# Patient Record
Sex: Male | Born: 1985 | Race: White | Hispanic: No | Marital: Single | Smoking: Former smoker
Health system: Southern US, Community
[De-identification: ages and names within clinical notes are randomized; demographics above are authoritative.]

---

## 2005-08-17 ENCOUNTER — Inpatient Hospital Stay (HOSPITAL_COMMUNITY): Admission: EM | Admit: 2005-08-17 | Discharge: 2005-08-19 | Payer: Self-pay | Admitting: Emergency Medicine
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
## 2017-09-26 DIAGNOSIS — J9801 Acute bronchospasm: Secondary | ICD-10-CM | POA: Diagnosis not present

## 2017-09-26 DIAGNOSIS — J309 Allergic rhinitis, unspecified: Secondary | ICD-10-CM | POA: Diagnosis not present

## 2017-12-09 DIAGNOSIS — H6123 Impacted cerumen, bilateral: Secondary | ICD-10-CM | POA: Diagnosis not present

## 2017-12-09 DIAGNOSIS — H9192 Unspecified hearing loss, left ear: Secondary | ICD-10-CM | POA: Diagnosis not present

## 2018-03-06 DIAGNOSIS — Z0000 Encounter for general adult medical examination without abnormal findings: Secondary | ICD-10-CM | POA: Diagnosis not present

## 2018-03-06 DIAGNOSIS — Z136 Encounter for screening for cardiovascular disorders: Secondary | ICD-10-CM | POA: Diagnosis not present

## 2018-03-06 DIAGNOSIS — Z23 Encounter for immunization: Secondary | ICD-10-CM | POA: Diagnosis not present

## 2018-03-06 DIAGNOSIS — Z13220 Encounter for screening for lipoid disorders: Secondary | ICD-10-CM | POA: Diagnosis not present

## 2020-03-15 ENCOUNTER — Encounter: Payer: Self-pay | Admitting: *Deleted

## 2020-03-15 ENCOUNTER — Telehealth: Payer: Self-pay | Admitting: *Deleted

## 2020-03-15 NOTE — Telephone Encounter (Signed)
Patient had Abbott ID now rapid antigen and PCR test performed at [facility] today.  Abbott binaxnow negative.  PCR test pending.  He reported that after drinking pot of tea this afternoon symptoms resolved except rhinitis mild now  Patient reported seasonal allergic rhinitis fall flare his usual.  Reviewed medical literature [email or web address]  Correlation between Abbott ID now and PCR tests "The study enrolled 785 symptomatic patients, of whom 21 were positive by both ID NOW and RT-PCR, and 2 only by RT-PCR. All 189 asymptomatic patients tested negative. The positive percent agreement between the ID NOW assay and the RT-PCR assay was 91.3%, and negative percent agreement was 100%. "  Patient cleared to return to work tomorrow by me but still has to obtain clearance from [employer] prior to returning onsite.  If new or worsening symptoms overnight especially fever, vomiting, diarrhea to stay home tomorrow and contact clinic staff via [email or web address] as clinic closed Wed/Sat/Sun.  RN Rolly Salter discussed case with me and will notify HR Tim and patient.

## 2020-03-15 NOTE — Telephone Encounter (Signed)
Noted agreed with plan of care 

## 2020-03-15 NOTE — Telephone Encounter (Signed)
RN notified by email that pt called out of work for today last night. Spoke with pt over phone. He reports camping outdoors this past weekend and started having a mild sore throat and voice hoarseness upon his return home yesterday, 10/11. He takes allergy meds year-round. No missed doses recently. Has already scheduled a rapid antigen test in [place] today at 1600. Case discussed with NP Inetta Fermo. Advised pt to keep testing appt today. Pt given RN and NP email addresses and asked to ask for brand/type of test when performed and send this to NP to check accuracy/sensitivity since not sendout PCR. Pt expets results back within of test today. Since clinic closed tomorrow, pt to email results this evening and sx will be checked again at that point. If improved and negative test will allow back to work tomorrow. If sx not resolved or positive test, will start quarantine. Pt fully vaccinated against Covid. Received Moderna vax x2 in April and May 2021.

## 2020-03-17 NOTE — Telephone Encounter (Signed)
Please verify no symptoms worsening/new and back at work

## 2021-03-23 ENCOUNTER — Telehealth: Payer: Self-pay | Admitting: *Deleted

## 2021-03-23 NOTE — Telephone Encounter (Signed)
Pt requested meeting time to discuss tobacco cessation. Met in clinic this afternoon. He reports he has been thinking about quitting smoking for his own health and for the wellness premium discount benefit. He seeks resources to assist him. Sts he doesn't feel he gets cravings for nicotine but more for the routine. He smokes a tobacco pipe and enjoys sitting outside on the porch but with colder weather coming feels this is a good time to enact the quitting he has been thinking about.  Reviewed Quit Smart classes through [employer]. Benefits of this program and high rate of success. Also provided links for [employer], QuitLine [place], and [email or web address]-[email or web address]/. Discussed triggers, journaling, setting a quit date, nicotine replacement therapy. Advised if he would like to discuss nicotine replacement therapy, an appt can be made with NP Otila Kluver.  Pt verbalizes understanding and will contact RN back once he reviews resources, need an appt, or any additional help.

## 2021-06-14 ENCOUNTER — Encounter: Payer: Self-pay | Admitting: Registered Nurse

## 2021-06-14 ENCOUNTER — Telehealth: Payer: Self-pay | Admitting: Registered Nurse

## 2021-06-14 DIAGNOSIS — Z20822 Contact with and (suspected) exposure to covid-19: Secondary | ICD-10-CM

## 2021-06-14 NOTE — Telephone Encounter (Signed)
HR [employer] Alexis notified NP that pt reported close covid contact on 06/12/2021 Day 0.  Currently asymptomatic and fully vaccinatinated/up to date with bivalent vaccine also.    Patient to wear mask x 10 days and covid test day 5 after exposure 06/17/2021.  Patient plans to covid test tonight after work also.  Discussed no eating in employee lunch room or at desk as he does not have private office.  Discussed may eat outside or in lactation/meditation rooms across from clinic or in car through day 10 06/22/2021.  Reviewed possible Covid symptoms including cough, shortness of breath with exertion or at rest, runny nose, congestion, sinus pain/pressure, sore throat, fever/chills, body aches, fatigue, loss of taste/smell, GI symptoms of nausea/vomiting/diarrhea.   Patient to isolate in own room and if possible use only one bathroom if living with others in home.  Wear mask when out of room to help prevent spread to others in household.  Sanitize high touch surfaces with lysol/chlorox/bleach spray or wipes daily as viruses are known to live on surfaces from 24 hours to days.  Patient currently does not have active my chart account.  Patient stated he would like to activate.  Discussed I can send him handouts and he can view his labs/office visit information in my chart.  Patient stated to send him activation test/completed.  Patient reported he has covid tests at home.  Patient asked if he could still visit his partner's father/family this weekend as father with cancer and undergoing chemotherapy.  I recommended video visit and reschedule his in person visit due to partner's father at risk for severe covid and I cannot guarantee he would not develop infection during trip.  Patient stated and with cold weather I would know if cold weather runny nose or covid.   Patient alert and oriented x3, spoke full sentences without difficulty.  No audible nasal congestion, cough or throat clearing during 8 minute telephone  call.  Discussed with patient he can contact me at [phone] when clinic  closed if questions or concerns.  RN Rolly Salter returns to clinic tomorrow [phone].   Pt verbalized understanding and agreement with plan of care. No further questions/concerns at this time. Pt reminded to contact clinic with any changes in symptoms or questions/concerns. HR notified patient covid testing tonight and again on 06/17/2021 currently asymptomatic and cleared to be onsite with strict mask wear through Day 10 and no eating in employee lunch room.  Patient to call out sick if symptoms develop or positive covid test and stay home/quarantine.

## 2021-06-15 MED ORDER — MOLNUPIRAVIR EUA 200MG CAPSULE: 4.0000 | ORAL_CAPSULE | Freq: Two times a day (BID) | ORAL | 0 refills | Status: AC

## 2021-06-15 NOTE — Telephone Encounter (Signed)
Patient returned call.  Reviewed Epic with patient regarding medical history/labs/imaging history.  Patient no labs on file.  Patient reported allergies and frequent sinus infections as a kid.  He required sinus surgery.  He stated quit smoking 03 Jun 2021 today day 12 without smoking.  Symptoms started this afternoon around 1530. Scratchy throat and runny nose. Height 5'10" and last weight 220lbs  Patient has received covid vaccinations and last dose this fall bivalent vaccine 07 Mar 2021 per HR records.  Discussed patient low risk for severe covid-smoking history, overweight.  Patient would like antiviral therapy.  [facility] first preference pharmacy [facility].  Discussed common side effects molnupiravir e.g. GI upset, dizziness with patient, use of contraception x 3 months and avoiding fathering children.  Emergency Use Authorization molnupiravir handout emailed to patient along with taking care of himself at home with covid/quarantine instructions.  Day 0 06/15/21  Day 5 06/20/2021  Day 10 06/25/2021 may return to work Day 6 with mask wear and no eating in employee lunch room through Day 10.  HR notified.  [name]&Ox3 spoke full sentences without difficulty; no cough/congestion/throat clearing audible during 20 minute call.  Reiterated increasing fresh air flow to home with window/hvac, disinfecting high touch surfaces daily, mask wear when in home common areas, frequent handwashing and isolating to single indoor room when possible things he can do to protect others in his household.  Discussed with patient if I have to send Rx to [place] location I would call him in the am as that pharmacy location currently closed.  If [facility] has in stock will send electronic Rx tonight. Patient can contact me at [phone] if questions or concerns and clinic closed.  RN Hildred Alamin [phone] when clinic open.  I will follow up with him this weekend for re-evaluation.  Patient  verbalized understanding information/instructions, agreed with plan of care and had no further questions at this time.    [facility] staff contacted drug in stock.  molnupiravir 221m sig t4 po BID x 5 days #40 RF0 sent to patient pharmacy of choice.  .10 Things You Can Do to Manage Your COVID-19 Symptoms at Home If you have possible or confirmed COVID-19 1.Stay home except to get medical care. 2.Monitor your symptoms carefully. If your symptoms get worse, call your healthcare provider immediately. 3.Get rest and stay hydrated. 4.If you have a medical appointment, call the healthcare provider ahead of time and tell them that you have or may have COVID-19. 5.For medical emergencies, call 911 and notify the dispatch personnel that you have or may have COVID-19. 6.Cover your cough and sneezes with a tissue or use the inside of your elbow. 7.Wash your hands often with soap and water for at least 20 seconds or clean your hands with an alcohol-based hand sanitizer that contains at least 60% alcohol. 8.As much as possible, stay in a specific room and away from other people in your home. Also, you should use a separate bathroom, if available. If you need to be around other people in or outside of the home, wear a mask. 9.Avoid sharing personal items with other people in your household, like dishes, towels, and bedding. 10.Clean all surfaces that are touched often, like counters, tabletops, and doorknobs. Use household cleaning sprays or wipes according to the label instructions. [email or web address]/16/2021 This information is not intended to replace advice given to you by your health care provider. Make sure you discuss any questions you have with  your health care provider. Document Revised: 02/10/2021 Document Reviewed: 02/10/2021 Elsevier Patient Education  [place] for Patients And Caregivers Emergency Use Authorization (EUA) Of LAGEVRIO  (molnupiravir) capsules For Coronavirus Disease 2019 (COVID-19) What is the most important information I should know about LAGEVRIO? LAGEVRIO may cause serious side effects, including:  LAGEVRIO may cause harm to your unborn baby. It is not known if LAGEVRIO will harm your baby if you take LAGEVRIO during pregnancy. o LAGEVRIO is not recommended for use in pregnancy. o LAGEVRIO has not been studied in pregnancy. LAGEVRIO was studied in pregnant animals only. When LAGEVRIO was given to pregnant animals, LAGEVRIO caused harm to their unborn babies. o You and your healthcare provider may decide that you should take LAGEVRIO during pregnancy if there are no other COVID-19 treatment options approved or authorized by the FDA that are accessible or clinically appropriate for you. o If you and your healthcare provider decide that you should take LAGEVRIO during pregnancy, you and your healthcare provider should discuss the known and potential benefits and the potential risks of taking LAGEVRIO during pregnancy. For individuals who are able to become pregnant:  You should use a reliable method of birth control (contraception) consistently and correctly during treatment with LAGEVRIO and for 4 days after the last dose of LAGEVRIO. Talk to your healthcare provider about reliable birth control methods.  Before starting treatment with [facility] your healthcare provider may do a pregnancy test to see if you are pregnant before starting treatment with LAGEVRIO.  Tell your healthcare provider right away if you become pregnant or think you may be pregnant during treatment with LAGEVRIO. Pregnancy Surveillance Program:  There is a pregnancy surveillance program for individuals who take LAGEVRIO during pregnancy. The purpose of this program is to collect information about the health of you and your baby. Talk to your healthcare provider about how to take part in this program.  If you take LAGEVRIO during pregnancy and you agree to  participate in the pregnancy surveillance program and allow your healthcare provider to share your information with [place], then your healthcare provider will report your use of [place] during pregnancy to [place]. by calling [phone] or [email or web address]. For individuals who are sexually active with partners who are able to become pregnant:  It is not known if LAGEVRIO can affect sperm. While the risk is regarded as low, animal studies to fully assess the potential for LAGEVRIO to affect the babies of males treated with LAGEVRIO have not been completed. A reliable method of birth control (contraception) should be used consistently and correctly during treatment with LAGEVRIO and for at least 3 months after the last dose. The risk to sperm beyond 3 months is not known. Studies to understand the risk to sperm beyond 3 months are ongoing. Talk to your healthcare provider about reliable birth control methods. Talk to your healthcare provider if you have questions or concerns about how LAGEVRIO may affect sperm. You are being given this fact sheet because your healthcare provider believes it is necessary to provide you with LAGEVRIO for the treatment of adults with mild-to-moderate coronavirus disease 2019 (COVID-19) with positive results of direct SARS-CoV-2 viral testing, and who are at high risk for progression to severe COVID-19 including hospitalization or death, and for whom other COVID-19 treatment options approved or authorized by the FDA are not accessible or clinically appropriate. The U.S. Food and Drug Administration (FDA) has issued an  Emergency Use Authorization (EUA) to make LAGEVRIO available during the COVID-19 pandemic (for more details about an EUA please see What is an Emergency Use Authorization? at the end of this document). LAGEVRIO is not an FDA-approved medicine in the [place]. Read this Fact Sheet for information about LAGEVRIO. Talk to your  healthcare provider about your options if you have any questions. It is your choice to take LAGEVRIO. What is COVID-19? COVID-19 is caused by a virus called a coronavirus. You can get COVID-19 through close contact with another person who has the virus. COVID-19 illnesses have ranged from very mild-to-severe, including illness resulting in death. While information so far suggests that most COVID-19 illness is mild, serious illness can happen and may cause some of your other medical conditions to become worse. Older people and people of all ages with severe, long lasting (chronic) medical conditions like heart disease, lung disease and diabetes, for example seem to be at higher risk of being hospitalized for COVID-19. What is LAGEVRIO? LAGEVRIO is an investigational medicine used to treat mild-to-moderate COVID-19 in adults:  with positive results of direct SARS-CoV-2 viral testing, and  who are at high risk for progression to severe COVID-19 including hospitalization or death, and for whom other COVID-19 treatment options approved or authorized by the FDA are not accessible or clinically appropriate. The FDA has authorized the emergency use of LAGEVRIO for the treatment of mild-tomoderate COVID-19 in adults under an EUA. For more information on EUA, see the What is an Emergency Use Authorization (EUA)? section at the end of this Fact Sheet. LAGEVRIO is not authorized:  for use in people less than 87 years of age.  for prevention of COVID-19.  for people needing hospitalization for COVID-19.  for use for longer than 5 consecutive days. What should I tell my healthcare provider before I take LAGEVRIO? Tell your healthcare provider if you:  Have any allergies  Are breastfeeding or plan to breastfeed  Have any serious illnesses  Are taking any medicines (prescription, over-the-counter, vitamins, or herbal products). How do I take LAGEVRIO?  Take LAGEVRIO exactly as your healthcare provider tells you to take it.  Take  4 capsules of LAGEVRIO every 12 hours (for example, at 8 am and at 8 pm)  Take LAGEVRIO for 5 days. It is important that you complete the full 5 days of treatment with LAGEVRIO. Do not stop taking LAGEVRIO before you complete the full 5 days of treatment, even if you feel better.  Take LAGEVRIO with or without food.  You should stay in isolation for as long as your healthcare provider tells you to. Talk to your healthcare provider if you are not sure about how to properly isolate while you have COVID-19.  Swallow LAGEVRIO capsules whole. Do not open, break, or crush the capsules. If you cannot swallow capsules whole, tell your healthcare provider.  What to do if you miss a dose: o If it has been less than 10 hours since the missed dose, take it as soon as you remember o If it has been more than 10 hours since the missed dose, skip the missed dose and take your dose at the next scheduled time.  Do not double the dose of LAGEVRIO to make up for a missed dose. What are the important possible side effects of LAGEVRIO?  See, What is the most important information I should know about LAGEVRIO?  Allergic Reactions. Allergic reactions can happen in people taking LAGEVRIO, even after only 1 dose.  Stop taking LAGEVRIO and call your healthcare provider right away if you get any of the following symptoms of an allergic reaction: o hives o rapid heartbeat o trouble swallowing or breathing o swelling of the mouth, lips, or face o throat tightness o hoarseness o skin rash The most common side effects of LAGEVRIO are:  diarrhea  nausea  dizziness These are not all the possible side effects of LAGEVRIO. Not many people have taken LAGEVRIO. Serious and unexpected side effects may happen. This medicine is still being studied, so it is possible that all of the risks are not known at this time. What other treatment choices are there? Veklury (remdesivir) is FDA-approved as an intravenous (IV) infusion for the treatment of  mildto-moderate TAVWP-79 in certain adults and children. Talk with your doctor to see if Marijean Heath is appropriate for you. Like LAGEVRIO, FDA may also allow for the emergency use of other medicines to treat people with COVID-19. Go to [email or web address] for more information. It is your choice to be treated or not to be treated with LAGEVRIO. Should you decide not to take it, it will not change your standard medical care. What if I am breastfeeding? Breastfeeding is not recommended during treatment with LAGEVRIO and for 4 days after the last dose of LAGEVRIO. If you are breastfeeding or plan to breastfeed, talk to your healthcare provider about your options and specific situation before taking LAGEVRIO. How do I report side effects with LAGEVRIO? Contact your healthcare provider if you have any side effects that bother you or do not go away. Report side effects to FDA MedWatch at SmoothHits.hu or call 1-800-FDA-1088 (1- 773-258-7668). How should I store [place]?  Store LAGEVRIO capsules at room temperature between 47F to 11F (20C to 25C).  Keep LAGEVRIO and all medicines out of the reach of children and pets. How can I learn more about COVID-19?  Ask your healthcare provider.  Visit SeekRooms.[email or web address]  Contact your local or state public health department.  Call [place] at [phone] (toll free in the [place]  Visit www.molnupiravir.com What Is an Emergency Use Authorization (EUA)? The [place] FDA has made [place] available under an emergency access mechanism called an Emergency Use Authorization (EUA) The EUA is supported by a Presenter, broadcasting Health and Human Service (HHS) declaration that circumstances exist to justify emergency use of drugs and biological products during the COVID-19 pandemic. LAGEVRIO for the treatment of mild-to-moderate COVID-19 in adults with positive results of  direct SARS-CoV-2 viral testing, who are at high risk for progression to severe COVID-19, including hospitalization or death, and for whom alternative COVID-19 treatment options approved or authorized by FDA are not accessible or clinically appropriate, has not undergone the same type of review as an FDA-approved product. In issuing an EUA under the OLMBE-67 public health emergency, the FDA has determined, among other things, that based on the total amount of scientific evidence available including data from adequate and well-controlled clinical trials, if available, it is reasonable to believe that the product may be effective for diagnosing, treating, or preventing COVID-19, or a serious or life-threatening disease or condition caused by COVID-19; that the known and potential benefits of the product, when used to diagnose, treat, or prevent such disease or condition, outweigh the known and potential risks of such product; and that there are no adequate, approved, and available alternatives. All of these criteria must be met to allow for the product to be used in the treatment of patients during the COVID-19  pandemic. The EUA for LAGEVRIO is in effect for the duration of the COVID-19 declaration justifying emergency use of LAGEVRIO, unless terminated or revoked (after which LAGEVRIO may no longer be used under the EUA). Manuf. for: [place] [address], [place] For patent information: http://rogers.info/ Copyright  [phone] [place], [place], [place] and its affiliates. All rights reserved. [id number] Revised: June 2022  Person Under Monitoring Name: FRIEND DORFMAN   Location: [place] [address]     Infection Prevention Recommendations for Individuals Confirmed to have, or Being Evaluated for, 2019 Novel Coronavirus (COVID-19) Infection Who Receive Care at Home   Individuals who are confirmed to have, or are being evaluated for, COVID-19  should follow the prevention steps below until a healthcare provider or local or state health department says they can return to normal activities.   Stay home except to get medical care You should restrict activities outside your home, except for getting medical care. Do not go to work, school, or public areas, and do not use public transportation or taxis.   Call ahead before visiting your doctor Before your medical appointment, call the healthcare provider and tell them that you have, or are being evaluated for, COVID-19 infection. This will help the healthcare providers office take steps to keep other people from getting infected. Ask your healthcare provider to call the local or state health department.   Monitor your symptoms Seek prompt medical attention if your illness is worsening (e.g., difficulty breathing). Before going to your medical appointment, call the healthcare provider and tell them that you have, or are being evaluated for, COVID-19 infection. Ask your healthcare provider to call the local or state health department.   Wear a facemask You should wear a facemask that covers your nose and mouth when you are in the same room with other people and when you visit a healthcare provider. People who live with or visit you should also wear a facemask while they are in the same room with you.   Separate yourself from other people in your home As much as possible, you should stay in a different room from other people in your home. Also, you should use a separate bathroom, if available.   Avoid sharing household items You should not share dishes, drinking glasses, cups, eating utensils, towels, bedding, or other items with other people in your home. After using these items, you should wash them thoroughly with soap and water.   Cover your coughs and sneezes Cover your mouth and nose with a tissue when you cough or sneeze, or you can cough or sneeze into your sleeve.  Throw used tissues in a lined trash can, and immediately wash your hands with soap and water for at least 20 seconds or use an alcohol-based hand rub.   Wash your [facility] your hands often and thoroughly with soap and water for at least 20 seconds. You can use an alcohol-based hand sanitizer if soap and water are not available and if your hands are not visibly dirty. Avoid touching your eyes, nose, and mouth with unwashed hands.     Prevention Steps for Caregivers and Household Members of Individuals Confirmed to have, or Being Evaluated for, COVID-19 Infection Being Cared for in the Home   If you live with, or provide care at home for, a person confirmed to have, or being evaluated for, COVID-19 infection please follow these guidelines to prevent infection:   Follow healthcare  providers instructions Make sure that you understand and can help the patient follow any healthcare provider instructions for all care.   Provide for the patients basic needs You should help the patient with basic needs in the home and provide support for getting groceries, prescriptions, and other personal needs.   Monitor the patients symptoms If they are getting sicker, call his or her medical provider and tell them that the patient has, or is being evaluated for, COVID-19 infection. This will help the healthcare providers office take steps to keep other people from getting infected. Ask the healthcare provider to call the local or state health department.   Limit the number of people who have contact with the patient ?If possible, have only one caregiver for the patient. ?Other household members should stay in another home or place of residence. If this is not possible, they should stay ?in another room, or be separated from the patient as much as possible. Use a separate bathroom, if available. ?Restrict visitors who do not have an essential need to be in the home.   Keep older adults, very young  children, and other sick people away from the patient Keep older adults, very young children, and those who have compromised immune systems or chronic health conditions away from the patient. This includes people with chronic heart, lung, or kidney conditions, diabetes, and cancer.   Ensure good ventilation Make sure that shared spaces in the home have good air flow, such as from an air conditioner or an opened window, weather permitting.   Wash your hands often ?Wash your hands often and thoroughly with soap and water for at least 20 seconds. You can use an alcohol based hand sanitizer if soap and water are not available and if your hands are not visibly dirty. ?Avoid touching your eyes, nose, and mouth with unwashed hands. ?Use disposable paper towels to dry your hands. If not available, use dedicated cloth towels and replace them when they become wet.   Wear a facemask and gloves ?Wear a disposable facemask at all times in the room and gloves when you touch or have contact with the patients blood, body fluids, and/or secretions or excretions, such as sweat, saliva, sputum, nasal mucus, vomit, urine, or feces.  Ensure the mask fits over your nose and mouth tightly, and do not touch it during use. ?Throw out disposable facemasks and gloves after using them. Do not reuse. ?Wash your hands immediately after removing your facemask and gloves. ?If your personal clothing becomes contaminated, carefully remove clothing and launder. Wash your hands after handling contaminated clothing. ?Place all used disposable facemasks, gloves, and other waste in a lined container before disposing them with other household waste. ?Remove gloves and wash your hands immediately after handling these items.   Do not share dishes, glasses, or other household items with the patient ?Avoid sharing household items. You should not share dishes, drinking glasses, cups, eating utensils, towels, bedding, or other items with a  patient who is confirmed to have, or being evaluated for, COVID-19 infection. ?After the person uses these items, you should wash them thoroughly with soap and water.   [place] thoroughly ?Immediately remove and wash clothes or bedding that have blood, body fluids, and/or secretions or excretions, such as sweat, saliva, sputum, nasal mucus, vomit, urine, or feces, on them. ?Wear gloves when handling laundry from the patient. ?Read and follow directions on labels of laundry or clothing items and detergent. In general, wash and dry with the  warmest temperatures recommended on the label.   Clean all areas the individual has used often ?Clean all touchable surfaces, such as counters, tabletops, doorknobs, bathroom fixtures, toilets, phones, keyboards, tablets, and bedside tables, every day. Also, clean any surfaces that may have blood, body fluids, and/or secretions or excretions on them. ?Wear gloves when cleaning surfaces the patient has come in contact with. ?Use a diluted bleach solution (e.g., dilute bleach with 1 part bleach and 10 parts water) or a household disinfectant with a label that says EPA-registered for coronaviruses. To make a bleach solution at home, add 1 tablespoon of bleach to 1 quart (4 cups) of water. For a larger supply, add  cup of bleach to 1 gallon (16 cups) of water. ?Read labels of cleaning products and follow recommendations provided on product labels. Labels contain instructions for safe and effective use of the cleaning product including precautions you should take when applying the product, such as wearing gloves or eye protection and making sure you have good ventilation during use of the product. ?Remove gloves and wash hands immediately after cleaning.   Monitor yourself for signs and symptoms of illness Caregivers and household members are considered close contacts, should monitor their health, and will be asked to limit movement outside of the home to the  extent possible. Follow the monitoring steps for close contacts listed on the symptom monitoring form.     ? If you have additional questions, contact your local health department or call the epidemiologist on call at [phone] (available 24/7). ? This guidance is subject to change. For the most up-to-date guidance from [facility], please refer to their website: [email or web address]

## 2021-06-15 NOTE — Telephone Encounter (Signed)
Notified by HR Tim patient reported his home covid test positive tonight.  Attempted to reach patient via telephone no answer left message.  Patient may contact NP when clinic closed if questions/concerns [phone] and RN Rolly Salter in clinic tomorrow [phone].  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 Spoke full sentences without difficulty. Gait sure and steady respirations even and unlabored skin warm dry and pink.

## 2021-06-20 NOTE — Telephone Encounter (Signed)
Spoke with pt by phone. He reports some lingering nasal congestion and mild sometimes productive cough but feeling much better than when sx started. Cleared to RTW onsite Day 6 1/18 with strict mask use thru Day 10 1/22.

## 2021-06-21 NOTE — Telephone Encounter (Signed)
Patient contacted via telephone and stated he left after 3 hours due to fatigue.  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  Some nausea prior to leaving work but denied vomiting/diarrhea.  Nasal congestion continues but denied new symptoms.  Feels able to return to work tomorrow. Discussed if doesn't feel able to finish shift tomorrow to come see clinic staff for evaluation.  [name]&Ox3 spoke full sentences without difficulty.  Nasal congestion audible during 2 minute telephone call.  No audible cough/throat clearing.  HR notified RTW as expected 1/18 but had to leave early due to fatigue.  Cleared to RTW 06/22/21.  Patient verbalized understanding information/instructions, agreed with plan of care and had no further questions at this time.

## 2021-06-22 NOTE — Telephone Encounter (Signed)
Pt returned call and reported he is onsite today. Feeling better than yesterday but fatigue and congestion persist.  He also reported that he realized he read the molnupiravir instructions incorrectly and was taking 1 pill 4x/day. So he only took about half. He wants to know if he should continue taking at the correct dose until finished or stop medication.

## 2021-06-22 NOTE — Telephone Encounter (Signed)
Discussed with NP Tina molnupiravir dosing. She recommends finishing antivirals at correct dosing. Pt made aware by phone.

## 2021-06-24 NOTE — Telephone Encounter (Signed)
Case discussed with RN Rolly Salter patient to start taking molnupiravir at correct dosage until completed and will follow up with patient via telephone this weekend.  Reviewed RN Rolly Salter note agreed with plan of care.

## 2021-07-09 NOTE — Telephone Encounter (Signed)
Patient contacted via telephone and he reported all symptoms resolved and feeling well.  Tested negative on Day 11.  First day back at work was tough/over exerted but since then symptoms improved then resolved and back to normal activity/health.  [name]&Ox3 spoke full sentences without difficulty; no audible cough/congestion/throat clearing during 2 minute call.  Encounter closed.

## 2021-11-28 ENCOUNTER — Ambulatory Visit: Payer: Self-pay | Admitting: *Deleted

## 2021-11-28 VITALS — BP 118/82 | Ht 72.0 in | Wt 256.0 lb

## 2021-11-28 DIAGNOSIS — E559 Vitamin D deficiency, unspecified: Secondary | ICD-10-CM

## 2021-11-28 DIAGNOSIS — Z0000 Encounter for general adult medical examination without abnormal findings: Secondary | ICD-10-CM

## 2021-11-29 LAB — CMP12+LP+TP+TSH+6AC+CBC/D/PLT
ALT: 12 IU/L (ref 0–44)
AST: 13 IU/L (ref 0–40)
Albumin/Globulin Ratio: 1.8 (ref 1.2–2.2)
Albumin: 4.6 g/dL (ref 4.0–5.0)
Alkaline Phosphatase: 58 IU/L (ref 44–121)
BUN/Creatinine Ratio: 12 (ref 9–20)
BUN: 12 mg/dL (ref 6–20)
Basophils Absolute: 0 10*3/uL (ref 0.0–0.2)
Basos: 1 %
Bilirubin Total: 0.6 mg/dL (ref 0.0–1.2)
Calcium: 8.8 mg/dL (ref 8.7–10.2)
Chloride: 101 mmol/L (ref 96–106)
Chol/HDL Ratio: 4.2 ratio (ref 0.0–5.0)
Cholesterol, Total: 207 mg/dL — ABNORMAL HIGH (ref 100–199)
Creatinine, Ser: 0.99 mg/dL (ref 0.76–1.27)
EOS (ABSOLUTE): 0.1 10*3/uL (ref 0.0–0.4)
Eos: 2 %
Estimated CHD Risk: 0.8 times avg. (ref 0.0–1.0)
Free Thyroxine Index: 1.7 (ref 1.2–4.9)
GGT: 18 IU/L (ref 0–65)
Globulin, Total: 2.5 g/dL (ref 1.5–4.5)
Glucose: 86 mg/dL (ref 70–99)
HDL: 49 mg/dL (ref >39)
Hematocrit: 45.4 % (ref 37.5–51.0)
Hemoglobin: 15.4 g/dL (ref 13.0–17.7)
Immature Grans (Abs): 0 10*3/uL (ref 0.0–0.1)
Immature Granulocytes: 0 %
Iron: 111 ug/dL (ref 38–169)
LDH: 202 IU/L (ref 121–224)
LDL Chol Calc (NIH): 129 mg/dL — ABNORMAL HIGH (ref 0–99)
Lymphocytes Absolute: 2.3 10*3/uL (ref 0.7–3.1)
Lymphs: 32 %
MCH: 30.1 pg (ref 26.6–33.0)
MCHC: 33.9 g/dL (ref 31.5–35.7)
MCV: 89 fL (ref 79–97)
Monocytes Absolute: 0.5 10*3/uL (ref 0.1–0.9)
Monocytes: 8 %
Neutrophils Absolute: 4.2 10*3/uL (ref 1.4–7.0)
Neutrophils: 57 %
Phosphorus: 3.3 mg/dL (ref 2.8–4.1)
Platelets: 222 10*3/uL (ref 150–450)
Potassium: 4.2 mmol/L (ref 3.5–5.2)
RBC: 5.11 x10E6/uL (ref 4.14–5.80)
RDW: 12.1 % (ref 11.6–15.4)
Sodium: 135 mmol/L (ref 134–144)
T3 Uptake Ratio: 28 % (ref 24–39)
T4, Total: 6.2 ug/dL (ref 4.5–12.0)
TSH: 1.91 u[IU]/mL (ref 0.450–4.500)
Total Protein: 7.1 g/dL (ref 6.0–8.5)
Triglycerides: 161 mg/dL — ABNORMAL HIGH (ref 0–149)
Uric Acid: 7.3 mg/dL (ref 3.8–8.4)
VLDL Cholesterol Cal: 29 mg/dL (ref 5–40)
WBC: 7.2 10*3/uL (ref 3.4–10.8)
eGFR: 101 mL/min/{1.73_m2} (ref >59)

## 2021-11-29 LAB — VITAMIN D 25 HYDROXY (VIT D DEFICIENCY, FRACTURES): Vit D, 25-Hydroxy: 16.5 ng/mL — ABNORMAL LOW (ref 30.0–100.0)

## 2021-11-29 LAB — HGB A1C W/O EAG: Hgb A1c MFr Bld: 5.2 % (ref 4.8–5.6)

## 2021-11-30 MED ORDER — CHOLECALCIFEROL 1.25 MG (50000 UT) PO TABS: 1.0000 | ORAL_TABLET | ORAL | 0 refills | Status: AC

## 2021-11-30 NOTE — Addendum Note (Signed)
Addended by: Albina Billet A on: 11/30/2021 09:21 AM   Modules accepted: Orders

## 2021-12-01 NOTE — Progress Notes (Signed)
Spoke with pt at workstation. Confirmed pharmacy of choice. Has not picked up Rx yet but plans to this weekend. PCP office confirmed as well. Tried to login to MyChart but could not complete setup. Will finish this weekend and review NP notes.

## 2021-12-07 NOTE — Progress Notes (Signed)
Reviewed RN Haley results note agreed with plan of care 

## 2022-01-23 ENCOUNTER — Ambulatory Visit: Payer: Self-pay | Admitting: Registered Nurse

## 2022-01-23 VITALS — BP 134/86 | HR 76 | Temp 98.6°F | Resp 16 | Ht 71.5 in | Wt 256.0 lb

## 2022-01-23 DIAGNOSIS — Z0279 Encounter for issue of other medical certificate: Secondary | ICD-10-CM

## 2022-01-23 DIAGNOSIS — J302 Other seasonal allergic rhinitis: Secondary | ICD-10-CM

## 2022-01-23 DIAGNOSIS — H0019 Chalazion unspecified eye, unspecified eyelid: Secondary | ICD-10-CM | POA: Insufficient documentation

## 2022-01-23 DIAGNOSIS — Z87891 Personal history of nicotine dependence: Secondary | ICD-10-CM | POA: Insufficient documentation

## 2022-01-23 DIAGNOSIS — E559 Vitamin D deficiency, unspecified: Secondary | ICD-10-CM

## 2022-01-23 DIAGNOSIS — E669 Obesity, unspecified: Secondary | ICD-10-CM | POA: Insufficient documentation

## 2022-01-23 NOTE — Progress Notes (Unsigned)
   Subjective:    Patient ID: Adam Petersen, male    DOB: 1986-05-03, 36 y.o.   MRN: [id number]  HPI    Review of Systems     Objective:   Physical Exam        Assessment & Plan:   A-medical certificate encounter, history of tobacco use  S-Employer/safety officer [employer] scheduled patient for DOT physical.  Stated drug testing is not to be performed.  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  Due to elevated lipids/obesity only 1 year certificate given.   Patient to follow up in 1 year. Passed snellen test today but has not seen optometry for routine exam in over 5 years discussed recommend routine eye exams every 2 years with optometrist of his choice.  Annual dental visits-patient stated scheduled for cleaning twice a year/exams.  Return sooner if new medical problems/hospitalization/ER visit.  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  Patient verbalized understanding information/instructions, agreed with plan of care and had no further questions at this time.

## 2022-01-24 ENCOUNTER — Encounter: Payer: Self-pay | Admitting: Registered Nurse

## 2022-01-24 DIAGNOSIS — J302 Other seasonal allergic rhinitis: Secondary | ICD-10-CM | POA: Insufficient documentation

## 2022-03-06 ENCOUNTER — Ambulatory Visit: Payer: Self-pay

## 2022-03-06 DIAGNOSIS — Z23 Encounter for immunization: Secondary | ICD-10-CM

## 2022-03-31 ENCOUNTER — Telehealth: Payer: Self-pay | Admitting: Registered Nurse

## 2022-03-31 ENCOUNTER — Encounter: Payer: Self-pay | Admitting: Registered Nurse

## 2022-03-31 DIAGNOSIS — E559 Vitamin D deficiency, unspecified: Secondary | ICD-10-CM

## 2022-03-31 NOTE — Telephone Encounter (Signed)
Spoke with patient via telephone stated has been trying to get more sun on skin over the summer.  Discussed he will schedule appt with RN Vinnie Level this week to check vitamin D level if still low will start cholecalciferol 50,000 units po weekly x 12 weeks and recheck level with 2024 [facility] labs.  Patient verbalized understanding information/instructions, agreed with plan of care and had no further questions at this time.

## 2022-04-09 ENCOUNTER — Other Ambulatory Visit: Payer: Self-pay

## 2022-04-09 DIAGNOSIS — E559 Vitamin D deficiency, unspecified: Secondary | ICD-10-CM

## 2022-04-09 NOTE — Progress Notes (Signed)
Lab drawn from Right antecubital without difficulty. 2x2 and coflex applied.

## 2022-04-10 ENCOUNTER — Other Ambulatory Visit: Payer: Self-pay | Admitting: Registered Nurse

## 2022-04-10 DIAGNOSIS — E559 Vitamin D deficiency, unspecified: Secondary | ICD-10-CM

## 2022-04-10 LAB — VITAMIN D 25 HYDROXY (VIT D DEFICIENCY, FRACTURES): Vit D, 25-Hydroxy: 11.8 ng/mL — ABNORMAL LOW (ref 30.0–100.0)

## 2022-04-10 MED ORDER — CHOLECALCIFEROL 1.25 MG (50000 UT) PO TABS: 1.0000 | ORAL_TABLET | ORAL | 0 refills | Status: AC

## 2022-05-02 NOTE — Telephone Encounter (Signed)
Patient had lab drawn 04/09/22   Latest Reference Range & Units 11/28/21 09:16 04/09/22 09:35  Vitamin D, 25-Hydroxy 30.0 - 100.0 ng/mL 16.5 (L) 11.8 (L)  (L): Data is abnormally low  Sent my chart message and discussed with patient.  Follow up the week after and he stated he had picked up rx and started supplement vitamin D.  Will follow up in 3 months for repeat lab.  Denied further questions or concerns at this time.

## 2022-05-20 ENCOUNTER — Encounter: Payer: Self-pay | Admitting: Registered Nurse

## 2022-05-20 ENCOUNTER — Telehealth: Payer: Self-pay | Admitting: Registered Nurse

## 2022-05-20 DIAGNOSIS — U071 COVID-19: Secondary | ICD-10-CM

## 2022-05-20 MED ORDER — MOLNUPIRAVIR EUA 200MG CAPSULE: 4.0000 | ORAL_CAPSULE | Freq: Two times a day (BID) | ORAL | 0 refills | Status: AC

## 2022-05-20 NOTE — Telephone Encounter (Signed)
Patient left message for clinic staff "Good afternoon.  I have been feeling the weather since Monday afternoon. Took a test Tuesday 12/12 morning before work. Test came back negative symptoms remained. I masked up Thursday 12/14 afternoon and rested. Felt great Friday 12/15 morning and didn't mask at work today until I got a text from my roommate that he's positive for Covid. I masked up and came strait home to test myself and it's positive. Image attached."  Patient left second message 05/20/22 staying home tomorrow 05/21/22 still waiting for clinic staff to contact him.  Patient contacted via telephone notified RN Bess Kinds returns to office Monday 0800-5 through Thursday.  I am on vacation until Thursday and was out of town. Patient stated he spoke with teledoc and started on azelastine along with his flonase and nasal saline.  Stated was told could not start molnupiravir based on symptom start date Monday 05/14/22.  Patient reported his symptoms worsened this weekend/today/changing.  He did not home test Wed or Thur.  Pt began quarantine after positive home test Friday 05/18/22. Patient did not develop symptoms of  trouble breathing, chest pain, nausea, vomiting, diarrhea.  Patient took molnupiravir last covid infection Jan 2023.  Was given EUA.  Discussed based on positive test/worsening symptoms he is eligible through Tuesday 05/22/22 to start molnupiravir.  Discussed with patient to call Kimrey or email [email or web address] if new or worsening symptoms tonight/tomorrow.   5 day quarantine per [facility] recommendations. Day 1 of quarantine was 05/18/22. If GI upset I have recommended clear fluids then bland diet.  Avoid dairy/spicy, fried and large portions of meat while having nausea.  If vomiting hold po intake x 1 hour.  Then sips clear fluids like broths, ginger ale, power ade, gatorade, pedialyte may advance to soft/bland if no vomiting x 24 hours and appetite returned otherwise hydration main focus.    Reviewed possible Covid symptoms including cough, shortness of breath with exertion or at rest, runny nose, congestion, sinus pain/pressure, sore throat, fever/chills, body aches, fatigue, loss of taste/smell, GI symptoms of nausea/vomiting/diarrhea. Also reviewed same day/emergent eval/ER precautions of dizziness/syncope, confusion, blue tint to lips/face, severe shortness of breath/difficulty breathing/wheezing.   Patient to isolate in own room and if possible use only one bathroom if living with others in home.  Wear mask when out of room to help prevent spread to others in household.  Sanitize high touch surfaces with lysol/chlorox/bleach spray or wipes daily as viruses are known to live on surfaces from 24 hours to days.  Patient does want antivirals.  Patient at higher risk for hospitalization due to obesity, tobacco use, prehypertension, and hyperlipidemia.  Patient is on prescription medications or daily medications. If taking medications epocrates medication interaction checker used to verify if any drug interactions. Only taking OTC cough/cold/fever medication at this time dayquil/nyquil/honey.  Patient molnupiravir emergency use handout sent to patient electronically along with covid quarantine exitcare handout both in my chart and to personal email.  Discussed how to take molnupiravir 400mg  take 4 tabs by mouth every 12 hours x 5 days #40 RF0 sent to his pharmacy of choice. Discussed lemonade can sometimes help with metallic/plastic taste in mouth (side effect medication).  Discussed most common side effects GI upset and bad taste in mouth.  Use birth control/avoid fathering children for 3 months after molnupiravir use.  Discussed I recommended not having sex with anyone while sick/testing positive/10 day quarantine as could spread virus to partner.  Discussed with patient RN will call him  again tomorrow to follow up symptoms/see if questions/concerns.    Patient has  azelastine/flonase/claritin.  They are working for him.  May use salt water gargles and nasal saline 2 sprays each nostril q2h prn congestion/sore throat.  Research has shown it helps to prevent hospitalizations and decrease discomfort.   May use flonase nasal 1 spray each nostril BID prn rhinitis.  Dayquil and nyquil per manufacturer instructions. Discussed honey 1 tablespoon every 4 hours is a natural cough suppressant.  Avoid dehydration and drink water to keep urine pale yellow clear and voiding every 2-4 hours while awake.  Patient alert and oriented x3, spoke full sentences without difficulty.  Some nasal congestion audible during telephone call.  No nasal congestion/cough/throat clearing/hoarse voice/wheezing/shortness of breath during 11 minute telephone call.  Discussed with patient can contact RN Kimrey M-Th [phone] at [phone].   Pt verbalized understanding and agreement with plan of care. No further questions/concerns at this time. Pt reminded to contact clinic with any changes in symptoms or questions/concerns. HR notified patient to work remote/quarantine through Day 5 RTW estimated Day 6 05/22/22 with strict mask wear through Day 10 05/27/22 and no eating in employee lunch room.  Estimated return to work onsite 05/22/2022  Supervisor Casimer Lanius notified of excused absence.

## 2022-05-21 ENCOUNTER — Ambulatory Visit: Payer: Self-pay | Admitting: Occupational Medicine

## 2022-05-21 DIAGNOSIS — U071 COVID-19: Secondary | ICD-10-CM

## 2022-05-21 NOTE — Progress Notes (Signed)
Phone call follow up with patient states sinus symptoms improving but fatigue and weakness slightly worse needs another day. Will follow up tomorrow. RTW extended to 12/20.

## 2022-05-22 ENCOUNTER — Ambulatory Visit: Payer: Self-pay | Admitting: Occupational Medicine

## 2022-05-22 DIAGNOSIS — U071 COVID-19: Secondary | ICD-10-CM

## 2022-05-22 NOTE — Telephone Encounter (Signed)
See RN Bess Kinds note 05/21/22 patient fatigue worsened re-evaluation tomorrow by Rene Kocher  Supervisor notified RTW date extended by RN Bess Kinds

## 2022-05-22 NOTE — Progress Notes (Signed)
Phone follow up call prior to coming back to work. Patient Fever free. No NVD. Pt feeling much better. Educated if any further issues to come to the clinic.   

## 2022-05-23 ENCOUNTER — Ambulatory Visit: Payer: Self-pay | Admitting: Occupational Medicine

## 2022-05-23 DIAGNOSIS — S60511A Abrasion of right hand, initial encounter: Secondary | ICD-10-CM

## 2022-05-23 NOTE — Progress Notes (Signed)
Patient tripped hit right hand and right hip. Reports some discomfort to right hip like going to bruise. Giving Biofreeze to apply as needed. Can use heat ice tylenol and ibuprofen as needed. Patient has abrasion to right pinky knuckle cleaned with antiseptic. Educated to come back if having worsen problems.

## 2022-05-26 NOTE — Telephone Encounter (Signed)
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  Denied concerns/questions.  Day 10 05/27/22

## 2022-05-28 NOTE — Telephone Encounter (Signed)
Patient reported feeling well denied concerns.  All symptoms resolved. A&Ox3 spoke full sentences without difficulty no audible nasal congestion/throat clearing/cough/shortness of breath of wheezing during 2 minute call.  Day 10 05/27/22 encounter closed.

## 2022-06-05 ENCOUNTER — Encounter: Payer: Self-pay | Admitting: Registered Nurse

## 2022-06-05 ENCOUNTER — Telehealth: Payer: Self-pay | Admitting: Registered Nurse

## 2022-06-05 ENCOUNTER — Ambulatory Visit: Payer: Self-pay | Admitting: Occupational Medicine

## 2022-06-05 DIAGNOSIS — J069 Acute upper respiratory infection, unspecified: Secondary | ICD-10-CM

## 2022-06-05 NOTE — Progress Notes (Signed)
Phone call follow extreme fatigue HA body aches. Only slightly better than this morning.  Recovering from GI virus. Will follow tomorrow notify HR and Supervisor. RTW tentative 06/07/21. Encourage to rest and push fluids water and Gatorade.

## 2022-06-06 ENCOUNTER — Ambulatory Visit: Payer: Self-pay | Admitting: Occupational Medicine

## 2022-06-06 DIAGNOSIS — J069 Acute upper respiratory infection, unspecified: Secondary | ICD-10-CM

## 2022-06-06 NOTE — Progress Notes (Signed)
  Phone call follow up doing much better. RTW tomorrow notify HR and Supervisor.

## 2022-06-06 NOTE — Telephone Encounter (Signed)
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  GI upset not uncommon for patient on long drives 02 Jun 3784.  Had meal with parents for Christmas and both tested positive for covid 30 May 2022.  Fatigue 03 Jun 2022 started worsened 1 Jan and 05 Jun 2022.  Can't find his thermometer at home unsure if fever but not having sweats/chills.  GI upset has resolved but "feeling like I was hit by a truck today"  "feels just like covid I recently had" (positive home test 05/18/22) Unable to work contacting clinic staff for advice.  Discussed covid test today and repeat in 48 hours.  He has tests at home and will covid test.  Patient had not tested because he thought if he tested was just from other infection still.  Discussed the differences between types of covid tests and some test for virus particles and others just live virus.  He has live virus tests at home.  [name]&Ox3 voice sounds fatigued/low energy.  Discussed hydrating with soup broth, gatorade, powerade.  Naps if needed.  Avoiding juice as can worsen diarrhea symptoms.  Discussed many viruses circulating in community flu, metapneumovirus, RSV, adenovirus, gastroenteritis viral, and covid.  JN1 newest covid variant circulating and has been on the rise in the past 4 weeks now accounting for 50% of [place] cases this week per CDC.  RN Kimrey notified HR team and supervisor patient excused absence today for viral illness and re-evaluation tomorrow.   Patient notified RN Evlyn Kanner will follow up with him prior to 5pm today/re-evaluation after nap.   [name]&Ox3 spoke full sentences without difficulty respirations even and unlabored no audible throat clearing/cough/wheezing/shortness of breath during 8 minute telephone call.  Patient verbalized understanding information/instructions, agreed with plan of care and had no further questions at this time.

## 2022-06-22 ENCOUNTER — Other Ambulatory Visit (HOSPITAL_BASED_OUTPATIENT_CLINIC_OR_DEPARTMENT_OTHER): Payer: Self-pay

## 2022-06-22 MED ORDER — COVID-19 MRNA VAC-TRIS(PFIZER) 30 MCG/0.3ML IM SUSY
0.3000 mL | PREFILLED_SYRINGE | Freq: Once | INTRAMUSCULAR | 0 refills | Status: AC
  Filled 2022-06-22: qty 0.3, 1d supply, fill #0

## 2022-06-28 ENCOUNTER — Telehealth: Payer: Self-pay | Admitting: Registered Nurse

## 2022-06-28 MED ORDER — OSELTAMIVIR PHOSPHATE 75 MG PO CAPS: 75.0000 mg | ORAL_CAPSULE | Freq: Two times a day (BID) | ORAL | 0 refills | Status: AC

## 2022-06-28 NOTE — Telephone Encounter (Signed)
Spoke with patient via telephone was notified by supervisor not feeling well.  Stated rhinitis/congestion started 06/26/22 pm.  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  Partner with him in truck starting to have symptoms today also.  Congestion/fatigue/body aches/rhinitis/fatigued/mental fog.  No thermometer with him unsure if fever/typically runs hots wears shorts year round.  His partner told him he feels hot today.  Had [facility] visit today and rx sent to pharmacy awaiting them to be filled and pick up at 3pm today.  Patient with recent covid infection Dec 2023.  Discussed same variants circulating in [place] at this time.  He stated does not feel like covid he had in December/different.  But his previous covid infection did not feel like Dec episode either.  Discussed with patient flu, RSV, adenovirus, covid all circulating in [place] at this time high.  Most likely not a new variant of covid and if fever/body aches/URI most likely flu.  Patient would like antivirals electronic Rx sent to [facility] tamiflu 75mg  po BID x 5 days #10 RF0.  Discussed hydrating/mask wear when around others to prevent spread/hand washing/hand sanitizing frequently.  Avoid dehydration drink water to keep urine pale yellow clear.  Discussed with patient to speak with his supervisor if he does not feel safe to drive at any time.  Patient plans to take nap now while waiting for pharmacy to fill his medication from [facility] prednisone/inhaler?  He has OTC cough/cold/allergy medication.  He thought at first this was allergy flare until worsening yesterday and today.  [name]&Ox3 spoke full sentences without difficulty respirations even and unlabored no audible cough/throat clearing during 10 minute call.  Flu and viral URI handouts sent to patient my chart account.  Patient to contact NP or teledoc if further questions or concerns.  Patient verbalized understanding  information/instructions, agreed with plan of care and had no further questions at this time.  Patient supervisor and HR notified viral URI most likely flu and I will re-evaluate patient via telephone this weekend.

## 2022-06-29 NOTE — Telephone Encounter (Signed)
Patient RTW 06/07/22.  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

## 2022-06-29 NOTE — Telephone Encounter (Signed)
Patient contacted via telephone stated he slept 18 hours last night.  Was unable to fill tamiflu but did get inhaler and it helped greatly with his breathing.  Not having mental fog today and energy level improved after restful sleep last night.  A&Ox3 spoke full sentences without difficulty no audible cough/congestion/throat clearing/wheezing or shortness of breath.  Patient plans to pick up tamiflu from pharmacy this weekend.  Discussed hydrating/rest/avoid skipping meals..  Patient agreed with plan of care and had no further questions at this time.

## 2022-07-01 NOTE — Telephone Encounter (Signed)
Patient contacted NP via telephone stated no Rx at [facility] when he went to pick up 06/30/22.  [facility]ed [facility]. Discussed can ask pharmacist to transfer Rx to his location or if I need to give new order today via telephone pharmacist can call [phone] as will not be at home with computer for a few more hours.  Patient stated feeling fatigued, some body aches, possible fever still today.  Will contact patient via telephone again tomorrow to follow up symptoms as fever will need to be resolved 24 hours before returning to work onsite per employer policy.  [name]&Ox3 spoke full sentences without difficulty, agreed with plan of care and had no further questions at this time.

## 2022-07-01 NOTE — Telephone Encounter (Unsigned)
Patient contacted via telephone and stated he was able to pick up and start tamilflu yesterday.  He has been using his albuterol inhaler with meals and prior to bed.  Flonase nasal spray otc 1 spray each nostril twice a day.  Still having productive cough and using nyquil OTC which is helping.  Patient has noticed some wheezing that resolves with inhaler use.  Discussed azelastine nasal spray good for allergy related runny nose.  Flonase can be used for allergies/virus or bacteria sinus infections.  Hydrate and eat meals. Discussed to notify me if worsening or not responding to inhaler use  Discussed may need steroid taper.  Patient reported no fever today but did have chills this am.  Was able to sleep well at home last night.  Patient scheduled off work 29 & 30 Jan.  Discussed will follow up with patient via telephone 03 Jul 2022.  [name]&Ox3 spoke full sentences without difficulty; no audible congestion/throat clearing/wheezing/shortness of breath or cough during 7 minute telephone call.

## 2022-07-03 NOTE — Telephone Encounter (Signed)
[name]&Ox3 spoke full sentences without difficulty; no audible congestion/throat clearing/wheezing/shortness of breath or cough during call. Patient reports still having a lot of hacking productive cough. Still using OTC meds helping. Still very fatigue no fever. Patient reports today having some lightheadedness. Will follow up tomorrow. Notify Supervisor and HR tentative RTW 2/1.

## 2022-07-04 ENCOUNTER — Telehealth: Payer: Self-pay | Admitting: Registered Nurse

## 2022-07-04 ENCOUNTER — Encounter: Payer: Self-pay | Admitting: Registered Nurse

## 2022-07-04 DIAGNOSIS — J111 Influenza due to unidentified influenza virus with other respiratory manifestations: Secondary | ICD-10-CM

## 2022-07-04 NOTE — Telephone Encounter (Signed)
Notified by RN Evlyn Kanner excused work absence extended due to URI symptoms and fatigue.  HR team and supervisor notified by RN Evlyn Kanner.  Re-evaluation 07/04/22 by RN Evlyn Kanner.

## 2022-07-04 NOTE — Telephone Encounter (Signed)
[name]&Ox3 spoke full sentences without difficulty; no audible congestion/throat clearing/wheezing/shortness of breath or cough during call. Patient reports still having productive cough and occasional wheezing on expiration. Reports Headache is worse today. Still having nasal congestion. Still using OTC meds helping. Still very fatigue no fever. Patient reports today having some lightheadedness. Will follow up tomorrow. Notify Supervisor and HR tentative RTW 2/2. Pt reports only being 50% improved.

## 2022-07-05 ENCOUNTER — Telehealth: Payer: Self-pay | Admitting: Occupational Medicine

## 2022-07-05 NOTE — Telephone Encounter (Signed)
Patient called to follow up on symptoms from Flu. Patient reports only 60% better Still wheezing some especially when lying. Appetite is coming back. Severe runny nose, hacking and coughing feels like wont come up. Encouraged to try Mucinex. Reminded because it thins secretion runny nose could get worse.Not sure if mucous is sinus or chest. Reports thinks is coming from throat like postnasal drip is having sinus pressure and worsen headaches in am. Reports chest feels congested like can't get up. Encourage more hydration and saline spray. No fever. Still having fatigue. Extended absence till 07/09/22. Hr and supervisor made aware.

## 2022-07-06 ENCOUNTER — Telehealth: Payer: Self-pay | Admitting: Registered Nurse

## 2022-07-06 ENCOUNTER — Encounter: Payer: Self-pay | Admitting: Registered Nurse

## 2022-07-06 DIAGNOSIS — E559 Vitamin D deficiency, unspecified: Secondary | ICD-10-CM

## 2022-07-06 DIAGNOSIS — J069 Acute upper respiratory infection, unspecified: Secondary | ICD-10-CM

## 2022-07-06 DIAGNOSIS — Z0000 Encounter for general adult medical examination without abnormal findings: Secondary | ICD-10-CM

## 2022-07-06 MED ORDER — SALINE SPRAY 0.65 % NA SOLN: 2.0000 | NASAL | 0 refills | Status: AC

## 2022-07-06 NOTE — Telephone Encounter (Signed)
Spoke with patient via telephone stated feeling better today slept until noon today.  Post nasal drip verified; denied mucous coming up from chest; he started mucinex yesterday and feeling much less congested.  Discussed I will contact him again on Sunday for re-evaluation for RTW Monday 07/09/22.  He is to contact me if new or worsening symptoms via telephone or email this weekend.  Discussed I am traveling for daughter volleyball tournament and will return his call as soon as I can[place].  A&Ox3 spoke full sentences without difficulty no audible cough/congestion/throat clearing during 2 minute call.  Discussed avoid dehydration and skipping meals.  Discussed traveling while ill and exertion ran down his immune system needs therapeutic rest this weekend.  Patient verbalized understanding information/instructions, agreed with plan of care and had no further questions at this time.

## 2022-07-08 NOTE — Telephone Encounter (Signed)
Spoke with patient via telephone stated still having some fatigue and blowing nose hourly. Used inhaler albuterol 3 times today.  Feels like he has enough energy to return to work tomorrow. Denied fever/vomiting/diarrhea.  Minimal cough.  A&Ox3 spoke full sentences without difficulty; some nasal congestion/throat clearing audible during 4 minute call.  No cough audible.  Patient feels well enough to return to work.  Discussed mask wear while symptomatic as could spread flu to others in droplets/mucous.  Frequent hand washing/sanitizing after blowing nose.  Discussed using albuterol 2 puffs on wake up and before bed and during the day as needed if protracted cough/wheezing/shortness of breath or chest tightness.  Patient to see RN Evlyn Kanner 07/09/22 for lunch check/spo2.  Patient verbalized understanding information/instructions, agreed with plan of care and had no further questions at this time.  HR Group and supervisor/manager notified he is cleared to RTW 07/09/22 and use normal call out procedures if wakes up with new/worsening symptoms in am.

## 2022-07-09 ENCOUNTER — Ambulatory Visit: Payer: Self-pay | Admitting: Occupational Medicine

## 2022-07-09 VITALS — BP 134/90 | HR 100 | Temp 98.0°F

## 2022-07-09 DIAGNOSIS — J111 Influenza due to unidentified influenza virus with other respiratory manifestations: Secondary | ICD-10-CM

## 2022-07-09 NOTE — Progress Notes (Signed)
Patient came in for fu from Flu almost fully recovered. Fatigue gone. Occasional wheeze right upper lung. Reports still some phlegm and coughing occasionally. Overall much improved RTW today. Educated if any issues to contact the clinic.

## 2022-07-11 NOTE — Telephone Encounter (Signed)
RN Evlyn Kanner notified NP spoke with patient feeling better 07/10/22 and at work wearing mask.

## 2022-07-27 NOTE — Telephone Encounter (Signed)
Patient reported symptoms resolved seen in workcenter denied concerns.  Lab orders placed for 2025 Be Well appt scheduled 08/02/22 Vitamin D, A1c and executive panel fasting.

## 2022-08-02 ENCOUNTER — Other Ambulatory Visit: Payer: Self-pay | Admitting: Occupational Medicine

## 2022-08-02 DIAGNOSIS — E559 Vitamin D deficiency, unspecified: Secondary | ICD-10-CM

## 2022-08-02 DIAGNOSIS — Z0000 Encounter for general adult medical examination without abnormal findings: Secondary | ICD-10-CM

## 2022-08-02 NOTE — Progress Notes (Signed)
Lab drawn tolerated well no issues noted.

## 2022-08-03 LAB — CMP12+LP+TP+TSH+6AC+CBC/D/PLT
Basophils Absolute: 0.1 10*3/uL (ref 0.0–0.2)
Eos: 2 %
Hematocrit: 45.4 % (ref 37.5–51.0)
Lymphocytes Absolute: 2.5 10*3/uL (ref 0.7–3.1)
Lymphs: 32 %
MCV: 89 fL (ref 79–97)
Monocytes Absolute: 0.6 10*3/uL (ref 0.1–0.9)
Monocytes: 7 %
Neutrophils Absolute: 4.6 10*3/uL (ref 1.4–7.0)
Neutrophils: 58 %
Platelets: 214 10*3/uL (ref 150–450)
WBC: 8 10*3/uL (ref 3.4–10.8)

## 2022-08-03 LAB — VITAMIN D 25 HYDROXY (VIT D DEFICIENCY, FRACTURES): Vit D, 25-Hydroxy: 15.9 ng/mL — ABNORMAL LOW (ref 30.0–100.0)

## 2022-08-04 ENCOUNTER — Encounter: Payer: Self-pay | Admitting: Registered Nurse

## 2022-08-04 LAB — CMP12+LP+TP+TSH+6AC+CBC/D/PLT
ALT: 12 IU/L (ref 0–44)
AST: 14 IU/L (ref 0–40)
Albumin/Globulin Ratio: 1.8 (ref 1.2–2.2)
Alkaline Phosphatase: 66 IU/L (ref 44–121)
BUN: 11 mg/dL (ref 6–20)
Basophils Absolute: 0.1 10*3/uL (ref 0.0–0.2)
Bilirubin Total: 0.3 mg/dL (ref 0.0–1.2)
Calcium: 9.6 mg/dL (ref 8.7–10.2)
Chloride: 105 mmol/L (ref 96–106)
Chol/HDL Ratio: 4 ratio (ref 0.0–5.0)
Globulin, Total: 2.5 g/dL (ref 1.5–4.5)
Glucose: 105 mg/dL — ABNORMAL HIGH (ref 70–99)
Hematocrit: 45.4 % (ref 37.5–51.0)
Hemoglobin: 15.3 g/dL (ref 13.0–17.7)
Immature Grans (Abs): 0 10*3/uL (ref 0.0–0.1)
Iron: 101 ug/dL (ref 38–169)
LDH: 167 IU/L (ref 121–224)
LDL Chol Calc (NIH): 114 mg/dL — ABNORMAL HIGH (ref 0–99)
MCHC: 33.7 g/dL (ref 31.5–35.7)
Neutrophils Absolute: 4.6 10*3/uL (ref 1.4–7.0)
Phosphorus: 3.4 mg/dL (ref 2.8–4.1)
Platelets: 214 10*3/uL (ref 150–450)
Potassium: 5.1 mmol/L (ref 3.5–5.2)
RDW: 12.2 % (ref 11.6–15.4)
T3 Uptake Ratio: 30 % (ref 24–39)
T4, Total: 7.5 ug/dL (ref 4.5–12.0)
VLDL Cholesterol Cal: 33 mg/dL (ref 5–40)
WBC: 8 10*3/uL (ref 3.4–10.8)

## 2022-08-04 LAB — HEMOGLOBIN A1C
Est. average glucose Bld gHb Est-mCnc: 105 mg/dL
Hgb A1c MFr Bld: 5.3 % (ref 4.8–5.6)

## 2022-08-04 MED ORDER — CHOLECALCIFEROL 1.25 MG (50000 UT) PO TABS: 1.0000 | ORAL_TABLET | ORAL | 1 refills | Status: AC

## 2022-08-04 MED ORDER — VITAMIN D3 25 MCG (1000 UT) PO CAPS: 1000.0000 [IU] | ORAL_CAPSULE | Freq: Every day | ORAL | 3 refills | Status: AC

## 2022-08-04 NOTE — Progress Notes (Signed)
My chart message sent to patient Adam Petersen, Your LDL cholesterol improving but still elevated.  Glucose spot now elevated also.  3 month blood sugar average normal.  Vitamin D still low but improving.  Consider taking the 50,000 units by mouth weekly with meal until weather/sunshine on skin improves.  Rx sent to your pharmacy of choice.  I recommend taking it during colder months repeat level in 6 months.  #12 RF1  You met requirements for Be Well insurance discount see RN Kimrey to sign paperwork A1c less than 7 and LDL less than 130.  Notify RN Evlyn Kanner if you want printed copy of results or results sent to another provider.Follow up with PCM elevated cholesterol, blood sugar and low Vitamin D; Cholesterol, high fiber diet, prediabetes/prediabetes diet and low vitamin D handouts sent to my chart.  I recommend 15 minutes sunlight on skin daily and 1000 international units D3 by mouth daily take with fattiest meal for best absorption when not taking the prescription D; BP and BMI elevated above recommended levels blood pressure greater than 110s/60s and height weight ratio of 25 I recommend weight loss, schedule appt with dietitian [facility] ([email or web address]), exercise 150 minutes per week; dietary fiber 30 grams per day men up to date; eat whole grains/fruits/vegetables; keep added sugars to less than 150 calories/9 teaspoons for men per American Heart Association;  electrolytes, iron, kidney/liver/thyroid function, and complete blood count normal  Please let us know if you have further questions and concerns.  Schedule next lab appts with RN Kimrey. Sincerely, Gerarda Fraction NP-C

## 2022-08-04 NOTE — Addendum Note (Signed)
Addended by: Gerarda Fraction A on: 08/04/2022 11:07 AM   Modules accepted: Orders

## 2022-08-13 ENCOUNTER — Ambulatory Visit: Payer: Self-pay | Admitting: Occupational Medicine

## 2022-08-13 VITALS — BP 138/90 | Ht 73.0 in | Wt 264.0 lb

## 2022-08-13 DIAGNOSIS — Z0000 Encounter for general adult medical examination without abnormal findings: Secondary | ICD-10-CM

## 2022-08-13 DIAGNOSIS — E559 Vitamin D deficiency, unspecified: Secondary | ICD-10-CM

## 2022-08-14 NOTE — Progress Notes (Signed)
Your LDL cholesterol improving but still elevated.  Glucose spot now elevated also.  3 month blood sugar average normal.  Vitamin D still low but improving.  Consider taking the 50,000 units by mouth weekly with meal until weather/sunshine on skin improves.  Rx sent to your pharmacy of choice.  NP recommend taking it during colder months repeat level in 6 months.  #12 RF1  Follow up with PCM elevated cholesterol, blood sugar and low Vitamin D; Cholesterol, high fiber diet, prediabetes/prediabetes diet and low vitamin D handouts sent to my chart.  NP recommend 15 minutes sunlight on skin daily and 1000 international units D3 by mouth daily take with fattiest meal for best absorption when not taking the prescription D; BP and BMI elevated above recommended levels blood pressure greater than 110s/60s and height weight ratio of 25 I recommend weight loss, schedule appt with dietitian [facility] ([email or web address])email linked , exercise 150 minutes per week; dietary fiber 30 grams per day men up to date; eat whole grains/fruits/vegetables; keep added sugars to less than 150 calories/9 teaspoons for men per American Heart Association;  electrolytes, iron, kidney/liver/thyroid function, and complete blood count normal  Please let us know if you have further questions and concerns.    Be well insurance premium discount evaluation:   Patient is smoker unable to continue be well at this time. Will consider tobacco cessation alteratives.   epic reviewed by RN Evlyn Kanner and transcribed labs.  Tobacco attestation signed. [facility] ROI formed signed. Forms placed in the chart.   Patient given handouts for [facility] pharmacies and discount drugs list,MyChart, Tele doc setup, [facility], [place] counseling and [facility] counseling.  What to do for infectious illness protocol. Given handout for list of medications that can be filled at [facility]. Given Clinic hours and Clinic Email.

## 2022-10-09 ENCOUNTER — Encounter: Payer: Self-pay | Admitting: Registered Nurse

## 2022-10-09 ENCOUNTER — Ambulatory Visit: Payer: Self-pay | Admitting: Registered Nurse

## 2022-10-09 DIAGNOSIS — F17200 Nicotine dependence, unspecified, uncomplicated: Secondary | ICD-10-CM | POA: Insufficient documentation

## 2022-10-09 DIAGNOSIS — H00011 Hordeolum externum right upper eyelid: Secondary | ICD-10-CM

## 2022-10-09 MED ORDER — CARBOXYMETHYLCELLULOSE SOD PF 0.5 % OP SOLN: 1.0000 [drp] | Freq: Three times a day (TID) | OPHTHALMIC | 0 refills | Status: AC | PRN

## 2022-10-09 MED ORDER — ERYTHROMYCIN 5 MG/GM OP OINT: 1.0000 | TOPICAL_OINTMENT | Freq: Four times a day (QID) | OPHTHALMIC | 0 refills | Status: AC

## 2022-10-09 NOTE — Patient Instructions (Signed)
Stye ?A stye, also known as a hordeolum, is a bump that forms on an eyelid. It may look like a pimple next to the eyelash. A stye can form inside the eyelid (internal stye) or outside the eyelid (external stye). A stye can cause redness, swelling, and pain on the eyelid. ?Styes are very common. Anyone can get them at any age. They usually occur in just one eye at a time, but you may have more than one in either eye. ?What are the causes? ?A stye is caused by an infection. The infection is almost always caused by bacteria called Staphylococcus aureus. This is a common type of bacteria that lives on the skin. ?An internal stye may result from an infected oil-producing gland inside the eyelid. An external stye may be caused by an infection at the base of the eyelash (hair follicle). ?What increases the risk? ?You are more likely to develop a stye if: ?You have had a stye before. ?You have any of these conditions: ?Red, itchy, inflamed eyelids (blepharitis). ?A skin condition such as seborrheic dermatitis or rosacea. ?High fat levels in your blood (lipids). ?Dry eyes. ?What are the signs or symptoms? ?The most common symptom of a stye is eyelid pain. Internal styes are more painful than external styes. Other symptoms may include: ?Painful swelling of your eyelid. ?A scratchy feeling in your eye. ?Tearing and redness of your eye. ?A pimple-like bump on the edge of the eyelid. ?Pus draining from the stye. ?How is this diagnosed? ?Your health care provider may be able to diagnose a stye just by examining your eye. The health care provider may also check to make sure: ?You do not have a fever or other signs of a more serious infection. ?The infection has not spread to other parts of your eye or areas around your eye. ?How is this treated? ?Most styes will clear up in a few days without treatment or with warm compresses applied to the area. You may need to use antibiotic drops or ointment to treat an infection. Sometimes,  steroid drops or ointment are used in addition to antibiotics. ?In some cases, your health care provider may give you a small steroid injection in the eyelid. ?If your stye does not heal with routine treatment, your health care provider may drain pus from the stye using a thin blade or needle. This may be done if the stye is large, causing a lot of pain, or affecting your vision. ?Follow these instructions at home: ?Take over-the-counter and prescription medicines only as told by your health care provider. This includes eye drops or ointments. ?If you were prescribed an antibiotic medicine, steroid medicine, or both, apply or use them as told by your health care provider. Do not stop using the medicine even if your condition improves. ?Apply a warm, wet cloth (warm compress) to your eye for 5-10 minutes, 4 to 6 times a day. ?Clean the affected eyelid as directed by your health care provider. ?Do not wear contact lenses or eye makeup until your stye has healed and your health care provider says that it is safe. ?Do not try to pop or drain the stye. ?Do not rub your eye. ?Contact a health care provider if: ?You have chills or a fever. ?Your stye does not go away after several days. ?Your stye affects your vision. ?Your eyeball becomes swollen, red, or painful. ?Get help right away if: ?You have pain when moving your eye around. ?Summary ?A stye is a bump that forms   on an eyelid. It may look like a pimple next to the eyelash. ?A stye can form inside the eyelid (internal stye) or outside the eyelid (external stye). A stye can cause redness, swelling, and pain on the eyelid. ?Your health care provider may be able to diagnose a stye just by examining your eye. ?Apply a warm, wet cloth (warm compress) to your eye for 5-10 minutes, 4 to 6 times a day. ?This information is not intended to replace advice given to you by your health care provider. Make sure you discuss any questions you have with your health care  provider. ?Document Revised: 07/27/2020 Document Reviewed: 07/27/2020 ?Elsevier Patient Education ? 2023 Elsevier Inc. ? ?

## 2022-10-09 NOTE — Progress Notes (Signed)
Subjective:    Patient ID: Adam Petersen, male    DOB: 01-05-1986, 37 y.o.   MRN: [id number]  37y/o caucasian male established patient here for evaluation stye/swelling right upper eyelid.  Has tried OTC eye drops and warm compresses.  Is not improving.  History or recurrent stye sometimes requiring drainage by eye doctor.  Denied fever/chills/changes in vision/headache.      Review of Systems  Constitutional:  Negative for chills and fever.  HENT:  Negative for congestion, trouble swallowing and voice change.   Eyes:  Positive for pain and redness. Negative for photophobia, discharge and visual disturbance.  Respiratory:  Negative for cough.   Cardiovascular:  Negative for chest pain.  Gastrointestinal:  Negative for diarrhea, nausea and vomiting.  Genitourinary:  Negative for difficulty urinating.  Musculoskeletal:  Negative for neck pain and neck stiffness.  Skin:  Positive for color change and rash. Negative for wound.  Allergic/Immunologic: Positive for environmental allergies.  Neurological:  Negative for dizziness, facial asymmetry, light-headedness, numbness and headaches.  Hematological:  Negative for adenopathy. Does not bruise/bleed easily.  Psychiatric/Behavioral:  Negative for agitation, confusion and sleep disturbance.        Objective:   Physical Exam Constitutional:      General: He is awake. He is not in acute distress.    Appearance: Normal appearance. He is well-developed and well-groomed. He is obese. He is not ill-appearing, toxic-appearing or diaphoretic.  HENT:     Head: Normocephalic and atraumatic.     Jaw: There is normal jaw occlusion.     Salivary Glands: Right salivary gland is not diffusely enlarged. Left salivary gland is not diffusely enlarged.     Right Ear: Hearing and external ear normal.     Left Ear: Hearing and external ear normal.     Nose: Nose normal. No congestion or rhinorrhea.     Mouth/Throat:     Lips: Pink. No lesions.      Mouth: Mucous membranes are moist.     Pharynx: Oropharynx is clear.  Eyes:     General: Lids are normal. Vision grossly intact. Gaze aligned appropriately. Allergic shiner present. No scleral icterus.       Right eye: Hordeolum present. No discharge.        Left eye: No discharge or hordeolum.     Extraocular Movements: Extraocular movements intact.     Conjunctiva/sclera: Conjunctivae normal.     Right eye: Right conjunctiva is not injected. No chemosis, exudate or hemorrhage.    Left eye: Left conjunctiva is not injected. No chemosis, exudate or hemorrhage.    Pupils: Pupils are equal, round, and reactive to light.      Comments: Right upper eyelid 1+/4 nonpitting edema mid eyelid/crease to lash line; macular erythema at lash line  Neck:     Trachea: Trachea and phonation normal. No abnormal tracheal secretions or tracheal deviation.  Pulmonary:     Effort: Pulmonary effort is normal.     Breath sounds: Normal breath sounds and air entry. No stridor or transmitted upper airway sounds. No wheezing.     Comments: Spoke full sentences without difficulty; no cough observed in exam room Abdominal:     General: Abdomen is flat.  Musculoskeletal:        General: Normal range of motion.     Cervical back: Normal range of motion and neck supple. No edema, erythema, signs of trauma, rigidity, torticollis or crepitus. No pain with movement. Normal range of motion.  Lymphadenopathy:  Head:     Right side of head: No submandibular or preauricular adenopathy.     Left side of head: No submandibular or preauricular adenopathy.     Cervical:     Right cervical: No superficial cervical adenopathy.    Left cervical: No superficial cervical adenopathy.  Skin:    General: Skin is warm and dry.     Capillary Refill: Capillary refill takes less than 2 seconds.     Coloration: Skin is not ashen, cyanotic, jaundiced, mottled, pale or sallow.     Findings: Erythema and rash present. No abrasion,  abscess, acne, bruising, burn, ecchymosis, signs of injury, laceration, lesion, petechiae or wound. Rash is macular. Rash is not crusting, nodular, papular, purpuric, pustular, scaling, urticarial or vesicular.     Nails: There is no clubbing.     Comments: Right upper eyelid  Neurological:     General: No focal deficit present.     Mental Status: He is alert and oriented to person, place, and time. Mental status is at baseline.     GCS: GCS eye subscore is 4. GCS verbal subscore is 5. GCS motor subscore is 6.     Cranial Nerves: Cranial nerves 2-12 are intact. No cranial nerve deficit, dysarthria or facial asymmetry.     Sensory: Sensation is intact.     Motor: Motor function is intact. No weakness, tremor, atrophy, abnormal muscle tone or seizure activity.     Coordination: Coordination is intact. Coordination normal.     Gait: Gait is intact. Gait normal.     Comments: In/out of chair without difficulty; gait sure and steady in clinic; bilateral hand grasp equal 5/5  Psychiatric:        Attention and Perception: Attention and perception normal.        Mood and Affect: Mood and affect normal.        Speech: Speech normal.        Behavior: Behavior normal. Behavior is cooperative.        Thought Content: Thought content normal.        Cognition and Memory: Cognition and memory normal.        Judgment: Judgment normal.           Assessment & Plan:  A-hordeolum externum right upper eyelid initial visit  P-Hygiene discussed.  Shower after work and prior to bedtime.  If any drainage on sheets/pillowcase you may want to launder/change more often along with your bathroom towels.  I recommend washing in hot water with bleach if you are having purulent eye discharge.  Consider using disposable wipes/towels on face.  Patient to apply warm moist packs prn up to QID. Instructed patient to not rub eyes.  Do not squeeze or pop stye/lesion.  May use over the counter eye drops/tears for pain/symptom  relief e.g refresh gtts 2 right eye TID x 7-14 days.  Erythromycin ophthalmic ointment apply 1 cm right eye QID x 7 days #1 RF0 dispensed from [facility] to patient.  Return to clinic if headache, fever greater than 100.51F, nausea/vomiting, purulent discharge/matting unable to open eye without using fingers, whole eyelid swells up or spreads to other part of your face, stye bleeds, foreign body sensation, ciliary flush, worsening photophobia or vision. Call or return to clinic as needed if these symptoms worsen or fail to improve as anticipated. Patient given [facility] handout on hordeolum. If stye does not resolve within a couple weeks you may need to see optometrist/ophthalmologist to drain stye.  Typically  a stye should get better and resolve in 1-2 weeks.  Patient verbalized agreement and understanding of treatment plan and had no further questions at this time. P2: Hand washing, avoid contact use-wear glasses.

## 2022-10-30 ENCOUNTER — Telehealth: Payer: Self-pay | Admitting: Registered Nurse

## 2022-10-30 ENCOUNTER — Encounter: Payer: Self-pay | Admitting: Registered Nurse

## 2022-10-30 DIAGNOSIS — H00011 Hordeolum externum right upper eyelid: Secondary | ICD-10-CM

## 2022-10-30 NOTE — Telephone Encounter (Signed)
Patient reported right upper eyelid hordeolum has resolved but scab still present on eyelid wondering if he needs to do anything else.  Denied pain/swelling/discharge.  Still has some erythromycin ophthalmic ointment at home  discussed apply ointment to scab avoid scratching or removing scab it is healing and no signs of infection.  Follow up re-evaluation if signs of infection e.g. redness/pain/swelling/discharge.  Patient agreed with plan of care and had no further questions at this time.

## 2022-11-01 ENCOUNTER — Ambulatory Visit: Payer: Self-pay | Admitting: Occupational Medicine

## 2022-11-01 VITALS — BP 143/87 | HR 81 | Temp 98.5°F | Resp 18

## 2022-11-01 DIAGNOSIS — J302 Other seasonal allergic rhinitis: Secondary | ICD-10-CM

## 2022-11-01 NOTE — Progress Notes (Signed)
Patient reports at [employer]ma cleaning and noted a lot of dust. Pt reports runny nose since then. Recent COVID exposure in work department wanted COVID test. Covid test negative. Patient given saline spray and nasal decongestant to help dry up. Allergy meds not helping at this time.

## 2022-11-04 NOTE — Telephone Encounter (Signed)
Spoke with patient via telephone symptoms improving denied concerns or questions.  A&Ox3 no audible congestion/throat clearing/cough during 2 minute call.

## 2022-11-18 NOTE — Progress Notes (Signed)
Noted patient has had follow up labs 

## 2022-12-30 ENCOUNTER — Other Ambulatory Visit: Payer: Self-pay | Admitting: Registered Nurse

## 2022-12-30 DIAGNOSIS — E559 Vitamin D deficiency, unspecified: Secondary | ICD-10-CM

## 2022-12-30 NOTE — Progress Notes (Signed)
Patient met with RN Kimrey3/11/24 discussed lab results/recommendations; scheduled D repeat 02/13/23 LDL 114 BP 138/90 weight 264lbs Hgba1c 5.3 ; patient met 2025 insurance discount requirements NP signed provider paperwork 08/14/22 RN Kimrey notified HR team my chart message sent to patient Adam Petersen, I have cancelled your vitamin D lab due to new national guidelines.  Finish the prescription 50,000 units once a week with meal then start 2000 units daily by mouth with meal over the counter. Please let us know if you have further questions Adam Billet NP-C

## 2023-01-15 ENCOUNTER — Encounter: Payer: Self-pay | Admitting: Registered Nurse

## 2023-01-15 ENCOUNTER — Ambulatory Visit: Payer: Self-pay | Admitting: Registered Nurse

## 2023-01-15 VITALS — BP 131/90 | HR 79

## 2023-01-15 DIAGNOSIS — H6123 Impacted cerumen, bilateral: Secondary | ICD-10-CM

## 2023-01-15 NOTE — Patient Instructions (Signed)
 Earwax Buildup, Adult Your ears make something called earwax. It helps keep germs called bacteria away and protects the skin in your ears. Sometimes, too much earwax can build up. This can cause discomfort or make it harder to hear. What are the causes? Earwax buildup can happen when you have too much earwax in your ears. Earwax is made in the outer part of your ear canal. It's supposed to fall out in small amounts over time. But if your ears aren't able to clean themselves like they should, earwax can build up. What increases the risk? You're more likely to get earwax buildup if: You clean your ears with cotton swabs. You pick at your ears. You use earplugs or in-ear headphones a lot. You wear hearing aids. You may also be more likely to get it if: You're male. You're older. Your ears naturally make more earwax. You have narrow ear canals or extra hair in your ears. Your earwax is too thick or sticky. You have eczema. You're dehydrated. This means there's not enough fluid in your body. What are the signs or symptoms? Symptoms of earwax buildup include: Not being able to hear as well. A feeling of fullness in your ear. Feeling like your ear is plugged. Fluid coming from your ear. Ear pain or an itchy ear. Ringing in your ear. Coughing or problems with balance. How is this diagnosed? Earwax buildup may be diagnosed based on your symptoms, medical history, and an ear exam. During the exam, your health care provider will look into your ear with a tool called an otoscope. You may also have tests, such as a hearing test. How is this treated? Earwax buildup may be treated by: Using ear drops. Having the earwax removed by a provider. The provider may: Flush the ear with water. Use a tool called a curette that has a loop on the end. Use a suction device. Having surgery. This may be done in severe cases. Follow these instructions at home:  Cleaning your ears Clean your ears as told  by your provider. You can clean the outside of your ears with a washcloth or tissue. Do not overclean your ears. Do not put anything into your ear unless told. This includes cotton swabs. General instructions Take over-the-counter and prescription medicines only as told by your provider. Drink enough fluid to keep your pee (urine) pale yellow. This helps thin the earwax. If you have hearing aids, clean them as told. Keep all follow-up visits. If earwax builds up in your ears often or if you use hearing aids, ask your provider how often you should have your ears cleaned. Contact a health care provider if: Your ear pain gets worse. You have a fever. You have pus, blood, or other fluid coming from your ear. You have hearing loss. You have ringing in your ears that won't go away. You feel like the room is spinning. This is called vertigo. Your symptoms don't get better with treatment. This information is not intended to replace advice given to you by your health care provider. Make sure you discuss any questions you have with your health care provider. Document Revised: 08/02/2022 Document Reviewed: 08/02/2022 Elsevier Patient Education  2024 [employer].

## 2023-01-15 NOTE — Progress Notes (Signed)
Subjective:    Patient ID: Adam Petersen, male    DOB: 1985-08-15, 37 y.o.   MRN: [id number]  37y/o established caucasian male here for evaluation decreased hearing and pain right ear after vacation to beach.  "I think ear wax built up again and water trapped behind it.  Restarted the wax drops and tried to dig out some of it myself but still can't hear and having pain"  Denied discharge, fever, chills, headache, bleeding from ears.      Review of Systems  Constitutional:  Negative for chills, diaphoresis and fever.  HENT:  Positive for ear pain and hearing loss. Negative for ear discharge, postnasal drip, rhinorrhea, trouble swallowing and voice change.   Eyes:  Negative for photophobia and visual disturbance.  Respiratory:  Negative for cough.   Gastrointestinal:  Negative for diarrhea, nausea and vomiting.  Musculoskeletal:  Negative for gait problem, neck pain and neck stiffness.  Skin:  Negative for color change, rash and wound.  Neurological:  Negative for dizziness, facial asymmetry, speech difficulty, light-headedness and headaches.  Hematological:  Negative for adenopathy.  Psychiatric/Behavioral:  Negative for agitation, confusion and sleep disturbance.        Objective:   Physical Exam Vitals and nursing note reviewed.  Constitutional:      General: He is awake. He is not in acute distress.    Appearance: Normal appearance. He is well-developed and well-groomed. He is obese. He is not ill-appearing, toxic-appearing or diaphoretic.  HENT:     Head: Normocephalic and atraumatic.     Jaw: There is normal jaw occlusion.     Salivary Glands: Right salivary gland is not diffusely enlarged or tender. Left salivary gland is not diffusely enlarged or tender.     Right Ear: Hearing and external ear normal. No decreased hearing noted. No laceration, drainage, swelling or tenderness. There is impacted cerumen. No foreign body. No mastoid tenderness.     Left Ear: Hearing and  external ear normal. No decreased hearing noted. No laceration, drainage, swelling or tenderness. There is impacted cerumen. No foreign body. No mastoid tenderness.     Ears:     Comments: Gold crumbly wax bilateral auditory canals obstructing TM 100%; patient reporting muffled hearing in exam room righter greater than left    Nose: Nose normal. No congestion or rhinorrhea.     Right Sinus: No maxillary sinus tenderness or frontal sinus tenderness.     Left Sinus: No maxillary sinus tenderness or frontal sinus tenderness.     Mouth/Throat:     Lips: Pink. No lesions.     Mouth: Mucous membranes are moist. No oral lesions or angioedema.     Dentition: No gum lesions.     Tongue: No lesions. Tongue does not deviate from midline.     Palate: No mass and lesions.     Pharynx: Oropharynx is clear. Uvula midline. No postnasal drip.     Tonsils: No tonsillar exudate.  Eyes:     General: Lids are normal. Vision grossly intact. Gaze aligned appropriately. Allergic shiner present. No scleral icterus.       Right eye: No discharge.        Left eye: No discharge.     Extraocular Movements: Extraocular movements intact.     Conjunctiva/sclera: Conjunctivae normal.     Pupils: Pupils are equal, round, and reactive to light.  Neck:     Trachea: Trachea and phonation normal.  Cardiovascular:     Rate and Rhythm: Normal  rate and regular rhythm.     Pulses: Normal pulses.          Radial pulses are 2+ on the right side and 2+ on the left side.  Pulmonary:     Effort: Pulmonary effort is normal.     Breath sounds: Normal breath sounds and air entry. No stridor or transmitted upper airway sounds. No wheezing.     Comments: Spoke full sentences without difficulty; no cough observed in exam room Abdominal:     General: Abdomen is flat.  Musculoskeletal:        General: Normal range of motion.     Right hand: Normal strength. Normal capillary refill.     Left hand: Normal strength. Normal capillary  refill.     Cervical back: Normal range of motion and neck supple. No swelling, edema, deformity, erythema, signs of trauma, lacerations, rigidity, spasms, torticollis, tenderness or crepitus. No pain with movement. Normal range of motion.     Thoracic back: No swelling, edema, deformity, signs of trauma, lacerations, spasms or tenderness.     Right lower leg: No edema.     Left lower leg: No edema.  Lymphadenopathy:     Head:     Right side of head: No submandibular, preauricular, posterior auricular or occipital adenopathy.     Left side of head: No submandibular, preauricular, posterior auricular or occipital adenopathy.     Cervical: No cervical adenopathy.     Right cervical: No superficial cervical adenopathy.    Left cervical: No superficial cervical adenopathy.  Skin:    General: Skin is warm and dry.     Capillary Refill: Capillary refill takes less than 2 seconds.     Coloration: Skin is not ashen, cyanotic, jaundiced, mottled, pale or sallow.     Findings: No abrasion, abscess, acne, bruising, burn, ecchymosis, erythema, signs of injury, laceration, lesion, petechiae, rash or wound.     Nails: There is no clubbing.  Neurological:     General: No focal deficit present.     Mental Status: He is alert and oriented to person, place, and time. Mental status is at baseline.     GCS: GCS eye subscore is 4. GCS verbal subscore is 5. GCS motor subscore is 6.     Cranial Nerves: Cranial nerves 2-12 are intact. No cranial nerve deficit, dysarthria or facial asymmetry.     Sensory: Sensation is intact.     Motor: Motor function is intact. No weakness, tremor, atrophy, abnormal muscle tone or seizure activity.     Coordination: Coordination is intact. Coordination normal.     Gait: Gait is intact. Gait normal.     Comments: In/out of chair and on/off exam table without difficulty; gait sure and steady in clinic; bilateral hand grasp equal 5/5  Psychiatric:        Attention and Perception:  Attention and perception normal.        Mood and Affect: Mood and affect normal.        Speech: Speech normal.        Behavior: Behavior normal. Behavior is cooperative.        Thought Content: Thought content normal.        Cognition and Memory: Cognition and memory normal.        Judgment: Judgment normal.           Assessment & Plan:  A-bilateral cerumen impaction  P-Verbal consent obtained for syringe irrigation and curettage of cerumen impaction bilaterally.  NP  performed syringe irrigation and curettage.  100% cerumen removed.  Patient reported slight discomfort external ear canal after procedure curretage extraction left/right ears, minor redness noted bilaterally and TMs intact without erythema.  Patient reported sounds are louder now.  Try 5-10gtts mineral oil external auditory canal daily prn itching and may put cotton ball at external ear for 20 minutes after drops instilled.  When in clinic have provider check ears to see if buildup with this method again.  Discussed purpose of earwax with patient. Discussed ear wax made when exposed to loud noises to help protect hearing. Avoid cotton applicator (Q-tip) use in ears.  exitcare handout on cerumen impaction  Patient verbalized understanding, agreed with plan of care and had no further questions at this time.

## 2023-02-07 ENCOUNTER — Encounter: Payer: Self-pay | Admitting: Registered Nurse

## 2023-02-07 ENCOUNTER — Telehealth: Payer: Self-pay | Admitting: Registered Nurse

## 2023-02-07 ENCOUNTER — Ambulatory Visit: Payer: Self-pay | Admitting: Registered Nurse

## 2023-02-07 VITALS — BP 130/88 | HR 83 | Temp 98.1°F | Resp 16

## 2023-02-07 DIAGNOSIS — J029 Acute pharyngitis, unspecified: Secondary | ICD-10-CM

## 2023-02-07 DIAGNOSIS — Z20822 Contact with and (suspected) exposure to covid-19: Secondary | ICD-10-CM

## 2023-02-07 MED ORDER — PENICILLIN V POTASSIUM 500 MG PO TABS: 500.0000 mg | ORAL_TABLET | Freq: Two times a day (BID) | ORAL | 0 refills | Status: DC

## 2023-02-07 NOTE — Progress Notes (Signed)
Subjective:    Patient ID: Adam Petersen, male    DOB: [birth year], 37 y.o.   MRN: [id number]  37y/o caucasian male single established patient here for evaluation sore throat for a couple days and notified close covid contact at work 02/06/23.  Denied fever/chills.  Has some rhinitis/post nasal drip and congestion nasal also.  Denied wheezing/dysphagia/dysphasia/dyspnea/n/v/d/f/c/loss of taste or smell  Has been around sick coworkers in the past week 1 diagnosed with covid 02/06/23      Review of Systems  Constitutional:  Negative for activity change, appetite change, chills, diaphoresis, fatigue, fever and unexpected weight change.  HENT:  Positive for congestion, postnasal drip, rhinorrhea and sore throat. Negative for dental problem, drooling, ear discharge, ear pain, facial swelling, hearing loss, mouth sores, nosebleeds, sinus pressure, sinus pain, sneezing, tinnitus, trouble swallowing and voice change.   Eyes:  Negative for photophobia, pain, discharge, redness, itching and visual disturbance.  Respiratory:  Negative for cough, choking, chest tightness, shortness of breath, wheezing and stridor.   Cardiovascular:  Negative for chest pain, palpitations and leg swelling.  Gastrointestinal:  Negative for abdominal distention, abdominal pain, blood in stool, constipation, diarrhea, nausea and vomiting.  Endocrine: Negative for cold intolerance and heat intolerance.  Genitourinary:  Negative for dysuria.  Musculoskeletal:  Negative for arthralgias, back pain, gait problem, joint swelling, myalgias, neck pain and neck stiffness.  Skin:  Negative for color change, pallor, rash and wound.  Allergic/Immunologic: Positive for environmental allergies. Negative for food allergies and immunocompromised state.  Neurological:  Negative for dizziness, tremors, seizures, syncope, facial asymmetry, speech difficulty, weakness, light-headedness, numbness and headaches.  Hematological:  Negative for  adenopathy. Does not bruise/bleed easily.  Psychiatric/Behavioral:  Negative for agitation, behavioral problems, confusion and sleep disturbance.        Objective:   Physical Exam Vitals and nursing note reviewed.  Constitutional:      General: He is awake. He is not in acute distress.    Appearance: Normal appearance. He is well-developed and well-groomed. He is obese. He is ill-appearing. He is not toxic-appearing or diaphoretic.  HENT:     Head: Normocephalic and atraumatic.     Jaw: There is normal jaw occlusion.     Salivary Glands: Right salivary gland is not diffusely enlarged or tender. Left salivary gland is not diffusely enlarged or tender.     Right Ear: Hearing, ear canal and external ear normal. No decreased hearing noted. No laceration, drainage, swelling or tenderness. A middle ear effusion is present. There is no impacted cerumen. No foreign body. No mastoid tenderness. No PE tube. No hemotympanum. Tympanic membrane is not injected, scarred, perforated, erythematous, retracted or bulging.     Left Ear: Hearing, ear canal and external ear normal. No decreased hearing noted. No laceration, drainage, swelling or tenderness. A middle ear effusion is present. There is no impacted cerumen. No foreign body. No mastoid tenderness. No PE tube. No hemotympanum. Tympanic membrane is not injected, scarred, perforated, erythematous, retracted or bulging.     Nose: Nose normal. No congestion or rhinorrhea.     Right Turbinates: Not enlarged, swollen or pale.     Left Turbinates: Not enlarged, swollen or pale.     Right Sinus: No maxillary sinus tenderness or frontal sinus tenderness.     Left Sinus: No maxillary sinus tenderness or frontal sinus tenderness.     Mouth/Throat:     Lips: Pink. No lesions.     Mouth: Mucous membranes are moist. No oral lesions.  Dentition: No gum lesions.     Tongue: No lesions. Tongue does not deviate from midline.     Palate: No mass and lesions.      Pharynx: Uvula midline. Pharyngeal swelling, posterior oropharyngeal erythema and postnasal drip present. No oropharyngeal exudate or uvula swelling.     Tonsils: No tonsillar exudate or tonsillar abscesses.     Comments: Macular erythema oropharynx; bilateral allergic shiners; cobblestoning posterior pharynx; clear discharge bilateral nares; audible nasal congestion and sniffing/throat clearing while in clinic Eyes:     General: Lids are normal. Vision grossly intact. Gaze aligned appropriately. Allergic shiner present. No scleral icterus.       Right eye: No discharge.        Left eye: No discharge.     Extraocular Movements: Extraocular movements intact.     Conjunctiva/sclera: Conjunctivae normal.     Pupils: Pupils are equal, round, and reactive to light.  Neck:     Trachea: Trachea and phonation normal. No abnormal tracheal secretions or tracheal deviation.  Cardiovascular:     Rate and Rhythm: Normal rate and regular rhythm.     Pulses: Normal pulses.          Radial pulses are 2+ on the right side and 2+ on the left side.     Heart sounds: Normal heart sounds, S1 normal and S2 normal.  Pulmonary:     Effort: Pulmonary effort is normal. No respiratory distress.     Breath sounds: Normal breath sounds and air entry. No stridor, decreased air movement or transmitted upper airway sounds. No decreased breath sounds, wheezing, rhonchi or rales.     Comments: Spoke full sentences without difficulty; no cough observed in exam room; wearing disposable surgical mask Abdominal:     General: Abdomen is flat. There is no distension.     Palpations: Abdomen is soft.     Tenderness: There is no guarding.  Musculoskeletal:        General: Normal range of motion.     Right hand: Normal strength. Normal capillary refill.     Left hand: Normal strength. Normal capillary refill.     Cervical back: Normal range of motion and neck supple. No swelling, edema, deformity, erythema, signs of trauma,  lacerations, rigidity, spasms, torticollis, tenderness, bony tenderness or crepitus. No pain with movement. Normal range of motion.     Thoracic back: No swelling, edema, deformity, signs of trauma, lacerations, spasms or tenderness. Normal range of motion.     Right lower leg: No edema.     Left lower leg: No edema.  Lymphadenopathy:     Head:     Right side of head: No submental, submandibular, tonsillar, preauricular, posterior auricular or occipital adenopathy.     Left side of head: No submental, submandibular, tonsillar, preauricular, posterior auricular or occipital adenopathy.     Cervical: No cervical adenopathy.     Right cervical: No superficial, deep or posterior cervical adenopathy.    Left cervical: No superficial or deep cervical adenopathy.  Skin:    General: Skin is warm and dry.     Capillary Refill: Capillary refill takes less than 2 seconds.     Coloration: Skin is not ashen, cyanotic, jaundiced, mottled, pale or sallow.     Findings: No abrasion, abscess, acne, bruising, burn, ecchymosis, erythema, signs of injury, laceration, lesion, petechiae, rash or wound.     Nails: There is no clubbing.  Neurological:     General: No focal deficit present.  Mental Status: He is alert and oriented to person, place, and time. Mental status is at baseline.     GCS: GCS eye subscore is 4. GCS verbal subscore is 5. GCS motor subscore is 6.     Cranial Nerves: Cranial nerves 2-12 are intact. No cranial nerve deficit, dysarthria or facial asymmetry.     Motor: Motor function is intact. No weakness, tremor, atrophy, abnormal muscle tone or seizure activity.     Coordination: Coordination is intact. Coordination normal.     Gait: Gait is intact. Gait normal.     Comments: In/out of chair without difficulty; gait sure and steady in clinic; bilateral hand grasp equal 5/5  Psychiatric:        Attention and Perception: Attention and perception normal.        Mood and Affect: Mood and  affect normal.        Speech: Speech normal.        Behavior: Behavior normal. Behavior is cooperative.        Thought Content: Thought content normal.        Cognition and Memory: Cognition and memory normal.        Judgment: Judgment normal.      Macular erythema oropharynx; cobblestoning bilateral TMs intact air fluid level clear wearing disposable mask  Home covid test negative     Assessment & Plan:   A-acute pharyngitis  P-most likely viral recovid test in 48 hours and send NP picture/notify me if positive.  Retest again in 2 days if second test negative discussed current variants not showing up positive at start of symptoms taking longer for positive test.  Stay home/quarantine if positive test or if vomiting/diarrhea.  Has covid tests at home does not need rx sent to pharmacy at this time. Usually no specific medical treatment is needed if a virus is causing the sore throat. The throat most often gets better on its own within 5 to 7 days. Antibiotic medicine does not cure viral pharyngitis.  -For acute pharyngitis caused by bacteria, your healthcare provider will prescribe an antibiotic.  -penicillin V 500mg  1 every 12 hours x 10 days #20 RF0 electronic Rx sent to patient pharmacy of choice if sees white spots back of throat -Motrin/Ibuprofen 800mg  by mouth every 8 hours with food or naproxen/naprosyn/aleve 500mg  by mouth every 12 hours with food prn pain/fever and/or Tylenol 1000mg  by mouth every 6 hours as needed for pain or fever OTC ER/call 911 if drooling, unable to swallow, trouble breathing discussed tonsillar abscess symptoms/hot potato voice. -Do not smoke.  -Avoid secondhand smoke and other air pollutants.  -Use a cool mist humidifier to add moisture to the air.  -Get plenty of rest; sleep 7-8 hours per night -You may want to rest your throat by talking less and eating a diet that is mostly liquid or soft for a day or two.  -Nonprescription throat lozenges and  mouthwashes should help relieve the soreness.  -Gargling with warm saltwater and drinking warm liquids may help. (You can make a saltwater solution by adding 1/4 teaspoon of salt to 8 ounces, or 240 mL, of warm water.)  -Change your toothbrush on your last day of antibiotic -Avoid oral intimate contact until antibiotics finished -Wash dishes/silverware/glasses in hot water or diswasher -Do not share glasses/silverware/dishes during meals that touch your lips/mouth -Usually no specific medical treatment is needed if a virus is causing the sore throat. The throat most often gets better on its own within 5 to  7 days. Antibiotic medicine does not cure viral pharyngitis. DDx mononucleosis, strep throat, hand foot and mouth, post nasal drip irritation pharynx/throat -Exitcare handout on pharyngitis acute and strep throat given to patient FOLLOW UP with clinic provider if no improvements in the next 7-10 days. Patient verbalized understanding of instructions and agreed with plan of care.  P2: Hand washing and diet.

## 2023-02-07 NOTE — Patient Instructions (Addendum)
Strep Throat, Adult Strep throat is an infection in the throat that is caused by bacteria. It is common during the cold months of the year. It mostly affects children who are 79-37 years old. However, people of all ages can get it at any time of the year. This infection spreads from person to person (is contagious) through coughing, sneezing, or having close contact. Your health care provider may use other names to describe the infection. When strep throat affects the tonsils, it is called tonsillitis. When it affects the back of the throat, it is called pharyngitis. What are the causes? This condition is caused by the Streptococcus pyogenes bacteria. What increases the risk? You are more likely to develop this condition if: You care for school-age children, or are around school-age children. Children are more likely to get strep throat and may spread it to others. You spend time in crowded places where the infection can spread easily. You have close contact with someone who has strep throat. What are the signs or symptoms? Symptoms of this condition include: Fever or chills. Redness, swelling, or pain in the tonsils or throat. Pain or difficulty when swallowing. White or yellow spots on the tonsils or throat. Tender glands in the neck and under the jaw. Bad smelling breath. Red rash all over the body. This is rare. How is this diagnosed? This condition is diagnosed by tests that check for the presence and the amount of bacteria that cause strep throat. They are: Rapid strep test. Your throat is swabbed and checked for the presence of bacteria. Results are usually ready in minutes. Throat culture test. Your throat is swabbed. The sample is placed in a cup that allows infections to grow. Results are usually ready in 1 or 2 days. How is this treated? This condition may be treated with: Medicines that kill germs (antibiotics). Medicines that relieve pain or fever. These include: Ibuprofen or  acetaminophen. Aspirin, only for people who are over the age of 37. Throat lozenges. Throat sprays. Follow these instructions at home: Medicines  Take over-the-counter and prescription medicines only as told by your health care provider. Take your antibiotic medicine as told by your health care provider. Do not stop taking the antibiotic even if you start to feel better. Eating and drinking  If you have trouble swallowing, try eating soft foods until your sore throat feels better. Drink enough fluid to keep your urine pale yellow. To help relieve pain, you may have: Warm fluids, such as soup and tea. Cold fluids, such as frozen desserts or popsicles. General instructions Gargle with a salt-water mixture 3-4 times a day or as needed. To make a salt-water mixture, completely dissolve -1 tsp (3-6 g) of salt in 1 cup (237 mL) of warm water. Get plenty of rest. Stay home from work or school until you have been taking antibiotics for 24 hours. Do not use any products that contain nicotine or tobacco. These products include cigarettes, chewing tobacco, and vaping devices, such as e-cigarettes. If you need help quitting, ask your health care provider. It is up to you to get your test results. Ask your health care provider, or the department that is doing the test, when your results will be ready. Keep all follow-up visits. This is important. How is this prevented?  Do not share food, drinking cups, or personal items that could cause the infection to spread to other people. Wash your hands often with soap and water for at least 20 seconds. If soap  and water are not available, use hand sanitizer. Make sure that all people in your house wash their hands well. Have family members tested if they have a sore throat or fever. They may need an antibiotic if they have strep throat. Contact a health care provider if: You have swelling in your neck that keeps getting bigger. You develop a rash, cough, or  earache. You cough up a thick mucus that is green, yellow-brown, or bloody. You have pain or discomfort that does not get better with medicine. Your symptoms seem to be getting worse. You have a fever. Get help right away if: You have new symptoms, such as vomiting, severe headache, stiff or painful neck, chest pain, or shortness of breath. You have severe throat pain, drooling, or changes in your voice. You have swelling of the neck, or the skin on the neck becomes red and tender. You have signs of dehydration, such as tiredness (fatigue), dry mouth, and decreased urination. You become increasingly sleepy, or you cannot wake up completely. Your joints become red or painful. These symptoms may represent a serious problem that is an emergency. Do not wait to see if the symptoms will go away. Get medical help right away. Call your local emergency services (911 in the [place] Do not drive yourself to the hospital. Summary Strep throat is an infection in the throat that is caused by the Streptococcus pyogenes bacteria. This infection is spread from person to person (is contagious) through coughing, sneezing, or having close contact. Take your medicines, including antibiotics, as told by your health care provider. Do not stop taking the antibiotic even if you start to feel better. To prevent the spread of germs, wash your hands well with soap and water. Have others do the same. Do not share food, drinking cups, or personal items. Get help right away if you have new symptoms, such as vomiting, severe headache, stiff or painful neck, chest pain, or shortness of breath. This information is not intended to replace advice given to you by your health care provider. Make sure you discuss any questions you have with your health care provider. Document Revised: 09/13/2020 Document Reviewed: 09/13/2020 Elsevier Patient Education  2024 [employer] Pharyngitis  Pharyngitis is inflammation of the throat  (pharynx). It is a very common cause of sore throat. Pharyngitis can be caused by a bacteria, but it is usually caused by a virus. Most cases of pharyngitis get better on their own without treatment. What are the causes? This condition may be caused by: Infection by viruses (viral). Viral pharyngitis spreads easily from person to person (is contagious) through coughing, sneezing, and sharing of personal items or utensils such as cups, forks, spoons, and toothbrushes. Infection by bacteria (bacterial). Bacterial pharyngitis may be spread by touching the nose or face after coming in contact with the bacteria, or through close contact, such as kissing. Allergies. Allergies can cause buildup of mucus in the throat (post-nasal drip), leading to inflammation and irritation. Allergies can also cause blocked nasal passages, forcing breathing through the mouth, which dries and irritates the throat. What increases the risk? You are more likely to develop this condition if: You are 77-62 years old. You are exposed to crowded environments such as daycare, school, or dormitory living. You live in a cold climate. You have a weakened disease-fighting (immune) system. What are the signs or symptoms? Symptoms of this condition vary by the cause. Common symptoms of this condition include: Sore throat. Fatigue. Low-grade fever. Stuffy nose (nasal  congestion) and cough. Headache. Other symptoms may include: Glands in the neck (lymph nodes) that are swollen. Skin rashes. Plaque-like film on the throat or tonsils. This is often a symptom of bacterial pharyngitis. Vomiting. Red, itchy eyes (conjunctivitis). Loss of appetite. Joint pain and muscle aches. Enlarged tonsils. How is this diagnosed? This condition may be diagnosed based on your medical history and a physical exam. Your health care provider will ask you questions about your illness and your symptoms. A swab of your throat may be done to check for  bacteria (rapid strep test). Other lab tests may also be done, depending on the suspected cause, but these are rare. How is this treated? Many times, treatment is not needed for this condition. Pharyngitis usually gets better in 3-4 days without treatment. Bacterial pharyngitis may be treated with antibiotic medicines. Follow these instructions at home: Medicines Take over-the-counter and prescription medicines only as told by your health care provider. If you were prescribed an antibiotic medicine, take it as told by your health care provider. Do not stop taking the antibiotic even if you start to feel better. Use throat sprays to soothe your throat as told by your health care provider. Children can get pharyngitis. Do not give your child aspirin because of the association with Reye's syndrome. Managing pain To help with pain, try: Sipping warm liquids, such as broth, herbal tea, or warm water. Eating or drinking cold or frozen liquids, such as frozen ice pops. Gargling with a mixture of salt and water 3-4 times a day or as needed. To make salt water, completely dissolve -1 tsp (3-6 g) of salt in 1 cup (237 mL) of warm water. Sucking on hard candy or throat lozenges. Putting a cool-mist humidifier in your bedroom at night to moisten the air. Sitting in the bathroom with the door closed for 5-10 minutes while you run hot water in the shower.  General instructions  Do not use any products that contain nicotine or tobacco. These products include cigarettes, chewing tobacco, and vaping devices, such as e-cigarettes. If you need help quitting, ask your health care provider. Rest as told by your health care provider. Drink enough fluid to keep your urine pale yellow. How is this prevented? To help prevent becoming infected or spreading infection: Wash your hands often with soap and water for at least 20 seconds. If soap and water are not available, use hand sanitizer. Do not touch your eyes,  nose, or mouth with unwashed hands, and wash hands after touching these areas. Do not share cups or eating utensils. Avoid close contact with people who are sick. Contact a health care provider if: You have large, tender lumps in your neck. You have a rash. You cough up green, yellow-brown, or bloody mucus. Get help right away if: Your neck becomes stiff. You drool or are unable to swallow liquids. You cannot drink or take medicines without vomiting. You have severe pain that does not go away, even after you take medicine. You have trouble breathing, and it is not caused by a stuffy nose. You have new pain and swelling in your joints such as the knees, ankles, wrists, or elbows. These symptoms may represent a serious problem that is an emergency. Do not wait to see if the symptoms will go away. Get medical help right away. Call your local emergency services (911 in the [place] Do not drive yourself to the hospital. Summary Pharyngitis is redness, pain, and swelling (inflammation) of the throat (pharynx). While pharyngitis  can be caused by a bacteria, the most common causes are viral. Most cases of pharyngitis get better on their own without treatment. Bacterial pharyngitis is treated with antibiotic medicines. This information is not intended to replace advice given to you by your health care provider. Make sure you discuss any questions you have with your health care provider. Document Revised: 08/17/2020 Document Reviewed: 08/17/2020 Elsevier Patient Education  2024 [employer].

## 2023-02-07 NOTE — Telephone Encounter (Signed)
Left message for patient he had close contact with another coworker who has tested positive for covid 02/06/23.  Please wear a mask at work and when around others for next 10 days.  I recommend covid testing day 6 if no symptoms (02/12/23) and if symptoms develop test and if negative retest x 2 48 hours later and if negative again 48 hours later.   Covid symptoms include pinkeye, fatigue, headache, chills, sore throat, nausea, vomiting, decreased appetite, diarrhea, body aches, cough, runny nose/congestion, sinus pain/pressure or altered taste/smell.  Some people think it is only allergies as runny nose/congestion their only symptom.    The current covid flirt variants are typically not testing positive on day 1 of symptoms.  We have seen day 2-4 before turning positive in the past 2 months.  Please eat outside during the next 10 days.  If inclement weather please contact HR and they will notify you of which private room available to eat indoors.  You may use microwaves in employee lunch room to heat up food.   If you need Rx sent for covid tests please let me know your pharmacy preference-insurance now requires a prescription for the 4 free per month.   If you test positive please email me a picture of positive test to this email.  If you would like covid antivirals if you test positive please let me know your pharmacy preference also.   Please let me know if you have further questions.

## 2023-02-10 NOTE — Telephone Encounter (Signed)
See 02/07/23 office note.

## 2023-02-12 ENCOUNTER — Ambulatory Visit: Payer: Self-pay | Admitting: Registered Nurse

## 2023-02-12 ENCOUNTER — Encounter: Payer: Self-pay | Admitting: Registered Nurse

## 2023-02-12 VITALS — Resp 16

## 2023-02-12 DIAGNOSIS — Z20822 Contact with and (suspected) exposure to covid-19: Secondary | ICD-10-CM

## 2023-02-12 NOTE — Progress Notes (Unsigned)
Subjective:    Patient ID: Adam Petersen, male    DOB: 01/21/1986, 37 y.o.   MRN: [id number]  HPI    Review of Systems     Objective:   Physical Exam Vitals and nursing note reviewed.  Constitutional:      General: He is awake. He is not in acute distress.    Appearance: Normal appearance. He is well-developed and well-groomed. He is obese. He is not ill-appearing, toxic-appearing or diaphoretic.  HENT:     Head: Normocephalic and atraumatic.     Jaw: There is normal jaw occlusion.     Salivary Glands: Right salivary gland is not diffusely enlarged. Left salivary gland is not diffusely enlarged.     Right Ear: Hearing and external ear normal.     Left Ear: Hearing and external ear normal.     Nose: Nose normal. No congestion or rhinorrhea.     Mouth/Throat:     Lips: Pink. No lesions.     Mouth: Mucous membranes are moist.     Pharynx: Oropharynx is clear.  Eyes:     General: Lids are normal. Vision grossly intact. Gaze aligned appropriately. No scleral icterus.       Right eye: No discharge.        Left eye: No discharge.     Extraocular Movements: Extraocular movements intact.     Conjunctiva/sclera: Conjunctivae normal.     Pupils: Pupils are equal, round, and reactive to light.  Neck:     Trachea: Trachea normal.  Pulmonary:     Effort: Pulmonary effort is normal. No respiratory distress.     Breath sounds: Normal breath sounds and air entry. No stridor or transmitted upper airway sounds. No wheezing.     Comments: Spoke full sentences without difficulty; no cough observed in exam room Abdominal:     Palpations: Abdomen is soft.  Musculoskeletal:        General: Normal range of motion.     Cervical back: Normal range of motion and neck supple.     Right lower leg: No edema.     Left lower leg: No edema.  Lymphadenopathy:     Head:     Right side of head: No submandibular or preauricular adenopathy.     Left side of head: No submandibular or preauricular  adenopathy.     Cervical:     Right cervical: No superficial cervical adenopathy.    Left cervical: No superficial cervical adenopathy.  Skin:    General: Skin is warm and dry.     Capillary Refill: Capillary refill takes less than 2 seconds.     Coloration: Skin is not ashen, cyanotic, jaundiced, mottled, pale or sallow.     Findings: No abrasion, bruising, burn, erythema, signs of injury, laceration, lesion, petechiae, rash or wound.  Neurological:     General: No focal deficit present.     Mental Status: He is alert and oriented to person, place, and time. Mental status is at baseline.     Cranial Nerves: No cranial nerve deficit.     Motor: Motor function is intact. No weakness, tremor, abnormal muscle tone or seizure activity.     Coordination: Coordination is intact. Coordination normal.     Gait: Gait is intact. Gait normal.     Comments: In/out of chair without difficulty; gait sure and steady in clinic; bilateral hand grasp equal 5/5  Psychiatric:        Attention and Perception: Attention and perception  normal.        Mood and Affect: Mood and affect normal.        Speech: Speech normal.        Behavior: Behavior normal. Behavior is cooperative.        Thought Content: Thought content normal.        Cognition and Memory: Cognition and memory normal.        Judgment: Judgment normal.    Home covid test results negative per patient/verified     Assessment & Plan:   Patient given free [place] govt test to perform prior to returning to [employer]

## 2023-02-13 ENCOUNTER — Other Ambulatory Visit: Payer: Self-pay

## 2023-02-15 NOTE — Telephone Encounter (Signed)
See office note 02/12/23.   Patient reported feeling well denied new symptoms all resolved since office visit.  A&Ox3 spoke full sentences without difficulty wearing mask in workcenter gait sure and steady respirations even and unlabored RA no audible cough/congestion/throat clearing or rhinitis.  Continue mask wear through day 10 when around others 02/16/23 and no eating in employee lunch room and monitoring for covid symptoms.  Re covid home test if symptoms develop.  Patient agreed with plan of care and had no further questions at this time.

## 2023-02-26 ENCOUNTER — Encounter: Payer: Self-pay | Admitting: Registered Nurse

## 2023-02-26 ENCOUNTER — Ambulatory Visit: Payer: Self-pay | Admitting: Registered Nurse

## 2023-02-26 VITALS — BP 126/84 | HR 78 | Temp 98.1°F

## 2023-02-26 DIAGNOSIS — J069 Acute upper respiratory infection, unspecified: Secondary | ICD-10-CM

## 2023-02-26 DIAGNOSIS — R110 Nausea: Secondary | ICD-10-CM

## 2023-02-26 NOTE — Progress Notes (Signed)
Subjective:    Patient ID: Adam Petersen, male    DOB: 03-Nov-1985, 37 y.o.   MRN: [id number]  37y/o single caucasian male here for evaluation of post nasal drip and nausea after using flonase nasal spray this am.  "Just feeling off this am"  Has not home covid tested this week.  Denied known sick contacts.  "My allergies have been acting up since [place]"  Denied diarrhea/vomiting/fever/chills.  My neck is a little sore also.     Review of Systems  Constitutional:  Positive for fatigue. Negative for chills, diaphoresis and fever.  HENT:  Positive for congestion, postnasal drip and rhinorrhea. Negative for sinus pressure and sinus pain.   Eyes:  Negative for photophobia and visual disturbance.  Respiratory:  Negative for cough, shortness of breath, wheezing and stridor.   Cardiovascular:  Negative for chest pain.  Gastrointestinal:  Positive for nausea. Negative for abdominal distention, abdominal pain, anal bleeding, blood in stool, diarrhea and vomiting.  Genitourinary:  Negative for difficulty urinating.  Musculoskeletal:  Positive for myalgias. Negative for back pain and gait problem.  Skin:  Negative for color change and rash.  Allergic/Immunologic: Positive for environmental allergies.  Neurological:  Positive for headaches. Negative for dizziness, tremors, seizures, syncope, facial asymmetry, speech difficulty, weakness, light-headedness and numbness.  Psychiatric/Behavioral:  Negative for agitation, confusion and sleep disturbance.        Objective:   Physical Exam Vitals and nursing note reviewed.  Constitutional:      General: He is awake. He is not in acute distress.    Appearance: Normal appearance. He is well-developed and well-groomed. He is obese. He is ill-appearing. He is not toxic-appearing or diaphoretic.  HENT:     Head: Normocephalic and atraumatic.     Jaw: There is normal jaw occlusion. No trismus.     Salivary Glands: Right  salivary gland is not diffusely enlarged or tender. Left salivary gland is not diffusely enlarged or tender.     Right Ear: Hearing, ear canal and external ear normal. No decreased hearing noted. No laceration, drainage, swelling or tenderness. A middle ear effusion is present. There is no impacted cerumen. No foreign body. No mastoid tenderness. No PE tube. No hemotympanum. Tympanic membrane is not injected, scarred, perforated, erythematous, retracted or bulging.     Left Ear: Hearing, ear canal and external ear normal. No decreased hearing noted. No laceration, drainage, swelling or tenderness. A middle ear effusion is present. There is no impacted cerumen. No foreign body. No mastoid tenderness. No PE tube. No hemotympanum. Tympanic membrane is not injected, scarred, perforated, erythematous, retracted or bulging.     Nose: Mucosal edema, congestion and rhinorrhea present. No nasal deformity, septal deviation or laceration. Rhinorrhea is clear.     Right Turbinates: Enlarged and swollen. Not pale.     Left Turbinates: Enlarged and swollen. Not pale.     Right Sinus: Maxillary sinus tenderness present. No frontal sinus tenderness.     Left Sinus: Maxillary sinus tenderness present. No frontal sinus tenderness.     Comments: Maxillary sinuses bilaterally mildly TTP; bilateral TMs intact air fluid level clear scant yellow cerumen bilateral canals auditory; bilateral allergic shiners; cobblestoning posterior pharynx; bilateral turbinates with clear discharge edema/erythema    Mouth/Throat:     Lips: Pink. No lesions.     Mouth: Mucous membranes are moist. Mucous membranes are not pale, not dry and not cyanotic. No lacerations, oral lesions or angioedema.  Dentition: No dental abscesses or gum lesions.     Tongue: No lesions. Tongue does not deviate from midline.     Palate: No mass and lesions.     Pharynx: Uvula midline. Pharyngeal swelling, posterior oropharyngeal erythema and postnasal drip  present. No oropharyngeal exudate or uvula swelling.     Tonsils: No tonsillar exudate or tonsillar abscesses.     Comments: Macular erythema oropharynx Eyes:     General: Lids are normal. Vision grossly intact. Gaze aligned appropriately. Allergic shiner present. No scleral icterus.       Right eye: No foreign body, discharge or hordeolum.        Left eye: No foreign body, discharge or hordeolum.     Extraocular Movements: Extraocular movements intact.     Right eye: Normal extraocular motion and no nystagmus.     Left eye: Normal extraocular motion and no nystagmus.     Conjunctiva/sclera: Conjunctivae normal.     Right eye: Right conjunctiva is not injected. No chemosis, exudate or hemorrhage.    Left eye: Left conjunctiva is not injected. No chemosis, exudate or hemorrhage.    Pupils: Pupils are equal, round, and reactive to light. Pupils are equal.     Right eye: Pupil is round and reactive.     Left eye: Pupil is round and reactive.  Neck:     Thyroid: No thyroid mass or thyromegaly.     Trachea: Trachea and phonation normal. No tracheal tenderness or tracheal deviation.  Cardiovascular:     Rate and Rhythm: Normal rate and regular rhythm.     Pulses: Normal pulses.          Radial pulses are 2+ on the right side and 2+ on the left side.     Heart sounds: Normal heart sounds, S1 normal and S2 normal. No murmur heard.    No friction rub. No gallop.  Pulmonary:     Effort: Pulmonary effort is normal. No respiratory distress.     Breath sounds: Normal breath sounds and air entry. No stridor or transmitted upper airway sounds. No decreased breath sounds, wheezing, rhonchi or rales.     Comments: Spoke full sentences without difficulty; no cough observed in exam room Abdominal:     General: Bowel sounds are decreased. There is no distension.     Palpations: Abdomen is soft. There is no mass.     Tenderness: There is abdominal tenderness in the left upper quadrant and left lower  quadrant. There is no guarding or rebound. Negative signs include Murphy's sign and McBurney's sign.     Hernia: No hernia is present.     Comments: Dull to percussion x 4 quads; mildly TTP LUQ/LLQ "feels like I need to pee after your press left side"; hypoactive bowel sounds x 4 quads; on/off exam table without difficulty standing to sitting to supine and reversed quickly without assistance or worsening pain  Musculoskeletal:        General: No tenderness. Normal range of motion.     Right hand: Normal strength. Normal capillary refill.     Left hand: Normal strength. Normal capillary refill.     Cervical back: Normal range of motion and neck supple. No swelling, edema, deformity, erythema, signs of trauma, lacerations, rigidity, spasms, torticollis, tenderness, bony tenderness or crepitus. No pain with movement, spinous process tenderness or muscular tenderness. Normal range of motion.     Thoracic back: No swelling, edema, deformity, signs of trauma, lacerations, spasms, tenderness or bony tenderness.  Normal range of motion.     Right lower leg: No edema.     Left lower leg: No edema.  Lymphadenopathy:     Head:     Right side of head: No submental, submandibular, tonsillar, preauricular, posterior auricular or occipital adenopathy.     Left side of head: No submental, submandibular, tonsillar, preauricular, posterior auricular or occipital adenopathy.     Cervical: No cervical adenopathy.     Right cervical: No superficial, deep or posterior cervical adenopathy.    Left cervical: No superficial, deep or posterior cervical adenopathy.  Skin:    General: Skin is warm and dry.     Capillary Refill: Capillary refill takes less than 2 seconds.     Coloration: Skin is not ashen, cyanotic, jaundiced, mottled, pale or sallow.     Findings: No abrasion, abscess, acne, bruising, burn, ecchymosis, erythema, signs of injury, laceration, lesion, petechiae, rash or wound.     Nails: There is no  clubbing.  Neurological:     General: No focal deficit present.     Mental Status: He is alert and oriented to person, place, and time. Mental status is at baseline.     GCS: GCS eye subscore is 4. GCS verbal subscore is 5. GCS motor subscore is 6.     Cranial Nerves: Cranial nerves 2-12 are intact. No cranial nerve deficit, dysarthria or facial asymmetry.     Sensory: No sensory deficit.     Motor: Motor function is intact. No weakness, tremor, atrophy, abnormal muscle tone or seizure activity.     Coordination: Coordination is intact. Coordination normal.     Gait: Gait is intact. Gait normal.     Comments: Gait sure and steady in clinic; in/out of chair and on/off exam table without difficulty; bilateral hand grasp equal 5/5  Psychiatric:        Attention and Perception: Attention and perception normal.        Mood and Affect: Mood and affect normal.        Speech: Speech normal.        Behavior: Behavior normal. Behavior is cooperative.        Thought Content: Thought content normal.        Cognition and Memory: Cognition and memory normal.        Judgment: Judgment normal.     Home covid test results negative      Assessment & Plan:  A-viral URI and nausea without vomiting  P-Given 1 free [place] go[employer] home covid test to perform prior to returning to [facility]; discussed covid and other viral illnesses circulating in community at this time.  Continue flonase nasal 1 spray each nostril BID and nasal saline 2 sprays each nostril q2h prn congestion/thick mucous OTC for allergies. Patient denied personal or family history of ENT cancer.  OTC antihistamine of choice claritin/zyrtec 10mg  po daily.  Avoid triggers if possible.  Shower prior to bedtime if exposed to triggers.  If allergic dust/dust mites recommend mattress/pillow covers/encasements; washing linens, vacuuming, sweeping, dusting weekly.  Call or return to clinic as needed if these symptoms worsen or fail to improve as  anticipated.   Exitcare handout on allergic rhinitis and sinus rinse given to patient.  Patient verbalized understanding of instructions, agreed with plan of care and had no further questions at this time.  P2:  Avoidance and hand washing.    I have recommended clear fluids and bland diet.  Avoid dairy/spicy, fried and large portions of  meat while having nausea.  If vomiting hold po intake x 1 hour.  Then sips clear fluids like broths, ginger ale, power ade, gatorade, pedialyte may advance to soft/bland if no vomiting x 24 hours and appetite returned otherwise hydration main focus.  Refused zofran rx at this time Return to the clinic if symptoms persist or worsen; I have alerted the patient to call if high fever, dehydration, marked weakness, fainting, increased abdominal pain, blood in stool or vomit (red or black).   Exitcare handout on nausea/vomiting.  Coffee/Tea are diuretic recommended gatorade/nondairy popsicles/ginger ale/broths first line if possible or alternating tea/water.   Patient verbalized agreement and understanding of treatment plan and had no further questions at this time.

## 2023-02-26 NOTE — Patient Instructions (Signed)
Nausea and Vomiting, Adult Nausea is the feeling that you have an upset stomach or that you are about to vomit. As nausea gets worse, it can lead to vomiting. Vomiting is when stomach contents forcefully come out of your mouth as a result of nausea. Vomiting can make you feel weak and cause you to become dehydrated. Dehydration can make you feel tired and thirsty, cause you to have a dry mouth, and decrease how often you urinate. Older adults and people with other diseases or a weak disease-fighting system (immune system) are at higher risk for dehydration. It is important to treat your nausea and vomiting as told by your health care provider. Follow these instructions at home: Watch your symptoms for any changes. Tell your health care provider about them. Eating and drinking     Take an oral rehydration solution (ORS). This is a drink that is sold at pharmacies and retail stores. Drink clear fluids slowly and in small amounts as you are able. Clear fluids include water, ice chips, low-calorie sports drinks, and fruit juice that has water added (diluted fruit juice). Eat bland, easy-to-digest foods in small amounts as you are able. These foods include bananas, applesauce, rice, lean meats, toast, and crackers. Avoid fluids that contain a lot of sugar or caffeine, such as energy drinks, sports drinks, and soda. Avoid alcohol. Avoid spicy or fatty foods. General instructions Take over-the-counter and prescription medicines only as told by your health care provider. Drink enough fluid to keep your urine pale yellow. Wash your hands often using soap and water for at least 20 seconds. If soap and water are not available, use hand sanitizer. Make sure that everyone in your household washes their hands well and often. Rest at home while you recover. Watch your condition for any changes. Take slow and deep breaths when you feel nauseous. Keep all follow-up visits. This is important. Contact a health  care provider if: Your symptoms get worse. You have new symptoms. You have a fever. You cannot drink fluids without vomiting. Your nausea does not go away after 2 days. You feel light-headed or dizzy. You have a headache. You have muscle cramps. You have a rash. You have pain while urinating. Get help right away if: You have pain in your chest, neck, arm, or jaw. You feel extremely weak or you faint. You have persistent vomiting. You have vomit that is bright red or looks like black coffee grounds. You have bloody or black stools (feces) or stools that look like tar. You have a severe headache, a stiff neck, or both. You have severe pain, cramping, or bloating in your abdomen. You have difficulty breathing, or you are breathing very quickly. Your heart is beating very quickly. Your skin feels cold and clammy. You feel confused. You have signs of dehydration, such as: Dark urine, very little urine, or no urine. Cracked lips. Dry mouth. Sunken eyes. Sleepiness. Weakness. These symptoms may be an emergency. Get help right away. Call 911. Do not wait to see if the symptoms will go away. Do not drive yourself to the hospital. Summary Nausea is the feeling that you have an upset stomach or that you are about to vomit. As nausea gets worse, it can lead to vomiting. Vomiting can make you feel weak and cause you to become dehydrated. Follow instructions from your health care provider about eating and drinking to prevent dehydration. Take over-the-counter and prescription medicines only as told by your health care provider. Contact your health care  provider if your symptoms get worse, or you have new symptoms. Keep all follow-up visits. This is important. This information is not intended to replace advice given to you by your health care provider. Make sure you discuss any questions you have with your health care provider. Document Revised: 11/25/2020 Document Reviewed:  11/25/2020 Elsevier Patient Education  2024 [employer] Viral Illness, Adult Viruses are tiny germs that can get into a person's body and cause illness. There are many different types of viruses. And they cause many types of illness. Viral illnesses can range from mild to severe. They can affect various parts of the body. Short-term conditions that are caused by a virus include colds and flu (influenza) and stomach viruses. Long-term conditions that are caused by a virus include herpes, shingles, and human immunodeficiency virus (HIV) infection. A few viruses have been linked to certain cancers. What are the causes? Many types of viruses can cause illness. Viruses get into cells in your body, multiply, and cause the infected cells to work differently or die. When these cells die, they release more of the virus. When this happens, you get symptoms of the illness and the virus spreads to other cells. If the virus takes over how the cell works, it can cause the cell to divide and grow out of control. This happens when a virus causes cancer. Different viruses get into the body in different ways. You can get a virus by: Swallowing food or water that has come in contact with the virus. Breathing in droplets that have been coughed or sneezed into the air by an infected person. Touching a surface that has the virus on it and then touching your eyes, nose, or mouth. Being bitten by an insect or animal that carries the virus. Having sexual contact with a person who is infected with the virus. Being exposed to blood or fluids that contain the virus, either through an open cut or during a transfusion. If a virus enters your body, your body's disease-fighting system (immune system) will try to fight the virus. You may be at higher risk for a viral illness if your immune system is weak. What are the signs or symptoms? Symptoms depend on the type of virus and the location of the cells that it gets into. Symptoms  can include: For cold and flu viruses: Fever. Headache. Sore throat. Muscle aches. Stuffy nose (nasal congestion). Cough. For stomach (gastrointestinal) viruses: Fever. Pain in the abdomen. Nausea or vomiting. Diarrhea. For liver viruses (hepatitis): Loss of appetite. Feeling tired. Skin or the white parts of your eyes turning yellow (jaundice). For brain and spinal cord viruses: Fever. Headache. Stiff neck. Nausea and vomiting. Confusion or being sleepy. For skin viruses: Warts. Itching. Rash. For sexually transmitted viruses: Discharge. Swelling. Redness. Rash. How is this diagnosed? This condition may be diagnosed based on one or more of these: Your symptoms and medical history. A physical exam. Tests, such as: Blood tests. Tests on a sample of mucus from your lungs (sputum sample). Tests on a poop (stool) sample. Tests on a swab of body fluids or a skin sore (lesion). How is this treated? Viruses can be hard to treat because they live within cells. Antibiotics do not treat viruses because these medicines do not get inside cells. Treatment for a viral illness may include: Resting and drinking a lot of fluids. Medicines to treat symptoms. These can include over-the-counter medicine for pain and fever, medicines for cough or congestion, and medicines for diarrhea. Antiviral medicines.  These medicines are available only for certain types of viruses. Some viral illnesses can be prevented with vaccinations. A common example is the flu shot. Follow these instructions at home: Medicines Take over-the-counter and prescription medicines only as told by your health care provider. If you were prescribed an antiviral medicine, take it as told by your provider. Do not stop taking the antiviral even if you start to feel better. Know when antibiotics are needed and when they are not needed. Antibiotics do not treat viruses. You may get an antibiotic if your provider thinks that  you may have, or are at risk for, a bacterial infection and you have a viral infection. Do not ask for an antibiotic prescription if you have been diagnosed with a viral illness. Antibiotics will not make your illness go away faster. Taking antibiotics when they are not needed can lead to antibiotic resistance. When this develops, the medicine no longer works against the bacteria that it normally fights. General instructions Drink enough fluids to keep your pee (urine) pale yellow. Rest as much as possible. Return to your normal activities as told by your provider. Ask your provider what activities are safe for you. How is this prevented? To lower your risk of getting another viral illness: Wash your hands often with soap and water for at least 20 seconds. If soap and water are not available, use hand sanitizer. Avoid touching your nose, eyes, and mouth, especially if you have not washed your hands recently. If anyone in your household has a viral infection, clean all household surfaces that may have been in contact with the virus. Use soap and hot water. You may also use a commercially prepared, bleach-containing solution. Stay away from people who are sick with symptoms of a viral infection. Do not share items such as toothbrushes and water bottles with other people. Keep your vaccinations up to date. This includes getting a yearly flu shot. Eat a healthy diet and get plenty of rest. Contact a health care provider if: You have symptoms of a viral illness that do not go away. Your symptoms come back after going away. Your symptoms get worse. Get help right away if: You have trouble breathing. You have a severe headache or a stiff neck. You have severe vomiting or pain in your abdomen. These symptoms may be an emergency. Get help right away. Call 911. Do not wait to see if the symptoms will go away. Do not drive yourself to the hospital. This information is not intended to replace advice  given to you by your health care provider. Make sure you discuss any questions you have with your health care provider. Document Revised: 06/06/2022 Document Reviewed: 03/21/2022 Elsevier Patient Education  2024 [employer].

## 2023-03-03 NOTE — Telephone Encounter (Signed)
Patient reported all symptoms resolved denied concerns A&Ox3 spoke full sentences without difficulty seen in [facility] skin warm dry and pink gait sure and steady  Denied positive home covid tests

## 2023-04-01 ENCOUNTER — Ambulatory Visit: Payer: Self-pay

## 2023-04-04 ENCOUNTER — Ambulatory Visit: Payer: Self-pay | Admitting: Registered Nurse

## 2023-04-04 ENCOUNTER — Encounter: Payer: Self-pay | Admitting: Registered Nurse

## 2023-04-04 VITALS — Temp 97.2°F | Resp 16

## 2023-04-04 DIAGNOSIS — J019 Acute sinusitis, unspecified: Secondary | ICD-10-CM

## 2023-04-04 DIAGNOSIS — H6993 Unspecified Eustachian tube disorder, bilateral: Secondary | ICD-10-CM

## 2023-04-04 NOTE — Patient Instructions (Signed)
 Sinus Pain  Sinus pain may occur when your sinuses become clogged or swollen. Sinuses are air-filled spaces in your skull that are behind the bones of your face and forehead. Sinus pain can range from mild to severe. What are the causes? Sinus pain can result from various conditions that affect the sinuses. Common causes include: Colds. Sinus infections. Allergies. What are the signs or symptoms? The main symptom of this condition is pain or pressure in your face, forehead, ears, or upper teeth. People who have sinus pain often have other symptoms, such as: Congested or runny nose. Fever. Inability to smell. Headache. Weather changes can make symptoms worse. How is this diagnosed? Your health care provider will diagnose this condition based on your symptoms and a physical exam. If you have pain that keeps coming back or does not go away, your health care provider may recommend more testing. This may include: Imaging tests, such as a CT scan or MRI, to check for problems with your sinuses. Examination of your sinuses using a thin tool with a camera that is inserted through your nose (endoscopy). How is this treated? Treatment for this condition depends on the cause. Sinus pain that is caused by a sinus infection may be treated with antibiotic medicine. Sinus pain that is caused by congestion may be helped by rinsing out (flushing) the nose and sinuses with saline solution. Sinus pain that is caused by allergies may be helped by allergy medicines (antihistamines) and medicated nasal sprays. Sinus surgery may be needed in some cases if other treatments do not help. Follow these instructions at home: General instructions If directed: Apply a warm, moist washcloth to your face to help relieve pain. Use a nasal saline wash. Follow the directions on the bottle or box. Hydrate and humidify Drink enough water to keep your urine clear or pale yellow. Staying hydrated will help to thin your  mucus. Use a humidifier if your home is dry. Inhale steam for 10-15 minutes, 3-4 times a day or as told by your health care provider. You can do this in the bathroom while a hot shower is running. Limit your exposure to cool or dry air. Medicines  Take over-the-counter and prescription medicines only as told by your health care provider. If you were prescribed an antibiotic medicine, take it as told by your health care provider. Do not stop taking the antibiotic even if you start to feel better. If you have congestion, use a nasal spray to help lessen pressure. Contact a health care provider if: You have sinus pain more than one time a week. You have sensitivity to light or sound. You develop a fever. You feel nauseous or you vomit. Your sinus pain or headache does not get better with treatment. Get help right away if: You have vision problems. You have sudden, severe pain in your face or head. You have a seizure. You are confused. You have a stiff neck. Summary Sinus pain occurs when your sinuses become clogged or swollen. Sinus pain can result from various conditions that affect the sinuses, such as a cold, a sinus infection, or an allergy. Treatment for this condition depends on the cause. It may include medicine, such as antibiotics or antihistamines. This information is not intended to replace advice given to you by your health care provider. Make sure you discuss any questions you have with your health care provider. Document Revised: 04/23/2021 Document Reviewed: 04/23/2021 Elsevier Patient Education  2024 [employer].

## 2023-04-04 NOTE — Progress Notes (Signed)
Subjective:    Patient ID: DYMOND EYE, male    DOB: Feb 25, 1986, 37 y.o.   MRN: [id number]  37y/o caucasian male established patient saw [facility] over the weekend started on amoxicillin for sinus infection still having congestion/pressure here for re-evaluation as typically antibiotics resolve symptoms fairly quickly.  Using nasal saline, hydrating and completing amoxicillin course.  Mucous clear.  Denied dysphagia, dysphasia, dyspnea, nosebleeds, fever or chills.      Review of Systems  Constitutional:  Positive for fatigue. Negative for chills and fever.  HENT:  Positive for congestion, postnasal drip, sinus pressure and sinus pain. Negative for ear discharge, ear pain, hearing loss, mouth sores, nosebleeds, sore throat, tinnitus, trouble swallowing and voice change.   Eyes:  Negative for photophobia and visual disturbance.  Respiratory:  Negative for cough, choking, shortness of breath, wheezing and stridor.   Cardiovascular:  Negative for chest pain.  Gastrointestinal:  Negative for diarrhea, nausea and vomiting.  Genitourinary:  Negative for difficulty urinating.  Musculoskeletal:  Negative for gait problem, neck pain and neck stiffness.  Skin:  Negative for rash.  Allergic/Immunologic: Positive for environmental allergies.  Neurological:  Negative for dizziness, tremors, seizures, syncope, facial asymmetry, speech difficulty, weakness, light-headedness, numbness and headaches.  Hematological:  Negative for adenopathy.  Psychiatric/Behavioral:  Negative for agitation, confusion and sleep disturbance.        Objective:   Physical Exam Vitals and nursing note reviewed.  Constitutional:      General: He is awake. He is not in acute distress.    Appearance: Normal appearance. He is well-developed and well-groomed. He is obese. He is not ill-appearing, toxic-appearing or diaphoretic.  HENT:     Head: Normocephalic and atraumatic.     Jaw: There is normal jaw occlusion.      Salivary Glands: Right salivary gland is not diffusely enlarged or tender. Left salivary gland is not diffusely enlarged or tender.     Right Ear: Hearing and external ear normal. No decreased hearing noted. No laceration, drainage, swelling or tenderness. A middle ear effusion is present. There is no impacted cerumen. No foreign body. No mastoid tenderness. No PE tube. No hemotympanum. Tympanic membrane is not injected, scarred, perforated, erythematous, retracted or bulging.     Left Ear: Hearing and external ear normal. No decreased hearing noted. No laceration, drainage, swelling or tenderness. A middle ear effusion is present. There is no impacted cerumen. No foreign body. No mastoid tenderness. No PE tube. No hemotympanum. Tympanic membrane is not injected, scarred, perforated, erythematous, retracted or bulging.     Ears:     Comments: Bilateral TMs intact air fluid level clear; cerumen in auditory canals dry crumbly air fluid level clear    Nose: Nose normal. No congestion or rhinorrhea.     Right Turbinates: Enlarged and swollen. Not pale.     Left Turbinates: Enlarged and swollen. Not pale.     Right Sinus: No maxillary sinus tenderness or frontal sinus tenderness.     Left Sinus: No maxillary sinus tenderness or frontal sinus tenderness.     Comments: Nasal congestion audible along with nasal sniffing; wearing cloth mask in clinic;     Mouth/Throat:     Lips: Pink. No lesions.     Mouth: Mucous membranes are moist. No oral lesions or angioedema.     Dentition: No gum lesions.     Tongue: No lesions. Tongue does not deviate from midline.     Palate: No mass and lesions.  Pharynx: Uvula midline. Pharyngeal swelling, posterior oropharyngeal erythema and postnasal drip present. No oropharyngeal exudate or uvula swelling.     Tonsils: No tonsillar exudate.     Comments: Cobblestoning posterior pharynx; bilateral allergic shiners Eyes:     General: Lids are normal. Vision grossly intact.  Gaze aligned appropriately. Allergic shiner present. No scleral icterus.       Right eye: No discharge.        Left eye: No discharge.     Extraocular Movements: Extraocular movements intact.     Conjunctiva/sclera: Conjunctivae normal.     Pupils: Pupils are equal, round, and reactive to light.  Neck:     Trachea: Trachea and phonation normal. No tracheal tenderness or abnormal tracheal secretions.  Cardiovascular:     Rate and Rhythm: Normal rate and regular rhythm.     Pulses: Normal pulses.          Radial pulses are 2+ on the right side and 2+ on the left side.     Heart sounds: Normal heart sounds, S1 normal and S2 normal.  Pulmonary:     Effort: Pulmonary effort is normal. No respiratory distress.     Breath sounds: Normal breath sounds and air entry. No stridor, decreased air movement or transmitted upper airway sounds. No decreased breath sounds, wheezing, rhonchi or rales.     Comments: Spoke full sentences without difficulty; no cough observed in exam room Abdominal:     General: Abdomen is flat.  Musculoskeletal:        General: Normal range of motion.     Right hand: Normal strength. Normal capillary refill.     Left hand: Normal strength. Normal capillary refill.     Cervical back: Normal range of motion and neck supple. No swelling, edema, deformity, erythema, signs of trauma, lacerations, rigidity, spasms, torticollis, tenderness or crepitus. No pain with movement or muscular tenderness. Normal range of motion.     Thoracic back: No swelling, edema, deformity, signs of trauma, lacerations, spasms or tenderness. Normal range of motion.     Right lower leg: No edema.     Left lower leg: No edema.  Lymphadenopathy:     Head:     Right side of head: No submental, submandibular, tonsillar, preauricular, posterior auricular or occipital adenopathy.     Left side of head: No submental, submandibular, tonsillar, preauricular, posterior auricular or occipital adenopathy.      Cervical: No cervical adenopathy.     Right cervical: No superficial, deep or posterior cervical adenopathy.    Left cervical: No superficial, deep or posterior cervical adenopathy.  Skin:    General: Skin is warm and dry.     Capillary Refill: Capillary refill takes less than 2 seconds.     Coloration: Skin is not ashen, cyanotic, jaundiced, mottled, pale or sallow.     Findings: No abrasion, abscess, acne, bruising, burn, ecchymosis, erythema, signs of injury, laceration, lesion, petechiae, rash or wound.     Nails: There is no clubbing.     Comments: Face/neck/hands evaluated  Neurological:     General: No focal deficit present.     Mental Status: He is alert and oriented to person, place, and time. Mental status is at baseline.     GCS: GCS eye subscore is 4. GCS verbal subscore is 5. GCS motor subscore is 6.     Cranial Nerves: Cranial nerves 2-12 are intact. No cranial nerve deficit, dysarthria or facial asymmetry.     Motor: Motor function  is intact. No weakness, tremor, atrophy, abnormal muscle tone or seizure activity.     Coordination: Coordination is intact. Coordination normal.     Gait: Gait is intact. Gait normal.     Comments: In/out of chair and on/off exam table without difficulty; gait sure and steady in clinic; bilateral hand grasp equal 5/5  Psychiatric:        Attention and Perception: Attention and perception normal.        Mood and Affect: Mood and affect normal.        Speech: Speech normal.        Behavior: Behavior normal. Behavior is cooperative.        Thought Content: Thought content normal.        Cognition and Memory: Cognition and memory normal.        Judgment: Judgment normal.           Assessment & Plan:  A-acute rhinosinusitis; eustachian tube dysfunction bilateral   P-Continue flonase 1 spray each nostril BID, saline 2 sprays each nostril q2h wa prn congestion. Finish amoxicillin 500mg  po TID from [facility]  Discussed with patient if  viral/allergic basis of sinusitis amoxicillin will not clear inflammation/mucous continue flonase/saline/hydrating with water.  Denied personal or family history of ENT cancer.  Shower BID especially prior to bed. No evidence of systemic bacterial infection, non toxic and well hydrated.  I do not see where any further testing or imaging is necessary at this time.   I will suggest supportive care, rest, good hygiene and encourage the patient to take adequate fluids.  The patient is to return to clinic or EMERGENCY ROOM if symptoms worsen or change significantly.  Exitcare handout on sinusitis and sinus rinse.  Patient verbalized agreement and understanding of treatment plan and had no further questions at this time.   P2:  Hand washing and cover cough    No evidence of invasive bacterial infection, non toxic and well hydrated.  I do not see where any further testing or imaging is necessary at this time.   I will suggest supportive care, rest, good hygiene and encourage the patient to take adequate fluids.  The patient is to return to clinic or EMERGENCY ROOM if symptoms worsen or change significantly e.g. ear pain, fever, purulent discharge from ears or bleeding.  Discussed with patient post nasal drip irritates throat/causes swelling blocks eustachian tubes from draining and fluid fills up middle ear.  Bacteria/viruses can grow in fluid and with moving head tube compressed and increases pressure in tube/ear worsening pain.  Studies show will take 30 days for fluid to resolve after post nasal drip controlled with nasal steroid/antihistamine. Antibiotics and steroids do not speed up fluid removal.  Patient verbalized agreement and understanding of treatment plan and had no further questions at this time.

## 2023-05-27 ENCOUNTER — Ambulatory Visit: Payer: Self-pay

## 2023-05-27 NOTE — Progress Notes (Unsigned)
EE in clinic this AM to have rib area checked after he fell doing a backflip on a trampoline last week.  States he has pain with moving the left arm or deep breathing or coughing, O:  Not tender over the torso to pressure on exam of the left ribs.  Lungs sound clear and breathing is normal depth.  No guarding on exam. A:  Contusion to torso with fall last week. P:  tylenol or advil prn for discomfort and continue with normal activities as tolerated.  Should heal within another 7 days.  Return to clinic as needed.   Reece Packer, RN, BSN, MPH, [facility]

## 2023-11-11 ENCOUNTER — Other Ambulatory Visit: Payer: Self-pay

## 2023-11-11 VITALS — BP 136/83 | Ht 71.5 in | Wt 274.0 lb

## 2023-11-11 DIAGNOSIS — Z0000 Encounter for general adult medical examination without abnormal findings: Secondary | ICD-10-CM

## 2023-11-11 NOTE — Progress Notes (Signed)
 [facility]

## 2023-11-12 ENCOUNTER — Ambulatory Visit: Payer: Self-pay | Admitting: Registered Nurse

## 2023-11-12 DIAGNOSIS — F17200 Nicotine dependence, unspecified, uncomplicated: Secondary | ICD-10-CM

## 2023-11-12 DIAGNOSIS — R030 Elevated blood-pressure reading, without diagnosis of hypertension: Secondary | ICD-10-CM

## 2023-11-12 DIAGNOSIS — E785 Hyperlipidemia, unspecified: Secondary | ICD-10-CM

## 2023-11-12 LAB — CMP12+LP+TP+TSH+6AC+CBC/D/PLT
ALT: 14 IU/L (ref 0–44)
AST: 17 IU/L (ref 0–40)
Albumin: 4.4 g/dL (ref 4.1–5.1)
Alkaline Phosphatase: 64 IU/L (ref 44–121)
BUN/Creatinine Ratio: 11 (ref 9–20)
BUN: 12 mg/dL (ref 6–20)
Basophils Absolute: 0 10*3/uL (ref 0.0–0.2)
Basos: 0 %
Bilirubin Total: 0.4 mg/dL (ref 0.0–1.2)
Calcium: 9.4 mg/dL (ref 8.7–10.2)
Chloride: 101 mmol/L (ref 96–106)
Chol/HDL Ratio: 4.6 ratio (ref 0.0–5.0)
Cholesterol, Total: 197 mg/dL (ref 100–199)
Creatinine, Ser: 1.07 mg/dL (ref 0.76–1.27)
EOS (ABSOLUTE): 0.2 10*3/uL (ref 0.0–0.4)
Eos: 2 %
Estimated CHD Risk: 0.9 times avg. (ref 0.0–1.0)
Free Thyroxine Index: 2.1 (ref 1.2–4.9)
GGT: 17 IU/L (ref 0–65)
Globulin, Total: 2.6 g/dL (ref 1.5–4.5)
Glucose: 92 mg/dL (ref 70–99)
HDL: 43 mg/dL (ref >39)
Hematocrit: 48.3 % (ref 37.5–51.0)
Hemoglobin: 15.5 g/dL (ref 13.0–17.7)
Immature Grans (Abs): 0 10*3/uL (ref 0.0–0.1)
Immature Granulocytes: 0 %
Iron: 80 ug/dL (ref 38–169)
LDH: 213 IU/L (ref 121–224)
LDL Chol Calc (NIH): 112 mg/dL — ABNORMAL HIGH (ref 0–99)
Lymphocytes Absolute: 2.7 10*3/uL (ref 0.7–3.1)
Lymphs: 30 %
MCH: 30.5 pg (ref 26.6–33.0)
MCHC: 32.1 g/dL (ref 31.5–35.7)
MCV: 95 fL (ref 79–97)
Monocytes Absolute: 0.6 10*3/uL (ref 0.1–0.9)
Monocytes: 7 %
Neutrophils Absolute: 5.4 10*3/uL (ref 1.4–7.0)
Neutrophils: 61 %
Phosphorus: 3.6 mg/dL (ref 2.8–4.1)
Platelets: 230 10*3/uL (ref 150–450)
Potassium: 4.8 mmol/L (ref 3.5–5.2)
RBC: 5.08 x10E6/uL (ref 4.14–5.80)
RDW: 12.5 % (ref 11.6–15.4)
Sodium: 138 mmol/L (ref 134–144)
T3 Uptake Ratio: 29 % (ref 24–39)
T4, Total: 7.1 ug/dL (ref 4.5–12.0)
TSH: 2.37 u[IU]/mL (ref 0.450–4.500)
Total Protein: 7 g/dL (ref 6.0–8.5)
Triglycerides: 241 mg/dL — ABNORMAL HIGH (ref 0–149)
Uric Acid: 7.7 mg/dL (ref 3.8–8.4)
VLDL Cholesterol Cal: 42 mg/dL — ABNORMAL HIGH (ref 5–40)
WBC: 9 10*3/uL (ref 3.4–10.8)
eGFR: 91 mL/min/{1.73_m2} (ref >59)

## 2023-11-12 LAB — HEMOGLOBIN A1C
Est. average glucose Bld gHb Est-mCnc: 108 mg/dL
Hgb A1c MFr Bld: 5.4 % (ref 4.8–5.6)

## 2023-11-27 ENCOUNTER — Ambulatory Visit: Payer: Self-pay

## 2023-11-27 VITALS — BP 114/73 | Temp 98.4°F

## 2023-11-27 DIAGNOSIS — R5383 Other fatigue: Secondary | ICD-10-CM

## 2023-11-27 NOTE — Progress Notes (Signed)
 C/o moderate fatigue and mild nausea, he stated he feels it is related to a large breakfast and the heat. Covid test performed. Results were neg. Employee had take 1 ibuprofen, encouraged him to take 1-2 more. This nurse stated if he continued to feel bad to come back to the clinic

## 2023-11-28 NOTE — Progress Notes (Unsigned)
 Follow up from 11/27/23 - saw employee in the warehouse. Employee states that he went home yesterday, slept for 6 hours and feels much better today. Feels fatigue and nausea was related to the heat.

## 2024-02-22 ENCOUNTER — Telehealth: Admitting: Nurse Practitioner

## 2024-02-22 DIAGNOSIS — M79645 Pain in left finger(s): Secondary | ICD-10-CM | POA: Diagnosis not present

## 2024-02-22 NOTE — Patient Instructions (Signed)
  Adam Petersen, thank you for joining Adam LELON Servant, NP for today's virtual visit.  While this provider is not your primary care provider (PCP), if your PCP is located in our provider database this encounter information will be shared with them immediately following your visit.   A Adam Petersen MyChart account gives you access to today's visit and all your visits, tests, and labs performed at [facility]  click here if you don't have a Adam Petersen MyChart account or go to [email or web address]  Consent: (Patient) Adam Petersen provided verbal consent for this virtual visit at the beginning of the encounter.  Current Medications:  Current Outpatient Medications:    albuterol (VENTOLIN HFA) 108 (90 Base) MCG/ACT inhaler, Inhale 1-2 puffs into the lungs every 4 (four) hours as needed for shortness of breath or wheezing., Disp: , Rfl:    amoxicillin (AMOXIL) 500 MG capsule, Take 500 mg by mouth 3 (three) times daily., Disp: , Rfl:    azelastine (ASTELIN) 0.1 % nasal spray, Place 2 sprays into both nostrils 2 (two) times daily., Disp: , Rfl:    fluticasone (FLONASE ALLERGY RELIEF) 50 MCG/ACT nasal spray, 1 spray in each nostril, Disp: , Rfl:    guaiFENesin (MUCINEX) 600 MG 12 hr tablet, Take 600 mg by mouth 2 (two) times daily as needed for to loosen phlegm or cough., Disp: , Rfl:    loratadine (CLARITIN) 10 MG tablet, 2 tablets, Disp: , Rfl:    penicillin  v potassium (VEETID) 500 MG tablet, Take 1 tablet (500 mg total) by mouth 2 (two) times daily., Disp: 20 tablet, Rfl: 0   sodium chloride (OCEAN) 0.65 % SOLN nasal spray, Place 2 sprays into both nostrils every 2 (two) hours while awake., Disp: , Rfl: 0   Medications ordered in this encounter:  No orders of the defined types were placed in this encounter.    *If you need refills on other medications prior to your next appointment, please contact your pharmacy*  Follow-Up: Call back or seek an in-person evaluation if the  symptoms worsen or if the condition fails to improve as anticipated.  [facility] [phone]  Other Instructions Continue using hydrogen peroxide and topical Neosporin to the area  Follow-up with dermatology for mole on lower left leg   If you have been instructed to have an in-person evaluation today at a local Urgent Care facility, please use the link below. It will take you to a list of all of our available [facility], including address, phone number and hours of operation. Please do not delay care.  [facility]  If you or a family member do not have a primary care provider, use the link below to schedule a visit and establish care. When you choose a [facility] primary care physician or advanced practice provider, you gain a long-term partner in health. Find a Primary Care Provider  Learn more about [facility] in-office and virtual care options: [facility] Get Care Now

## 2024-02-22 NOTE — Progress Notes (Signed)
 Virtual Visit Consent   Adam Petersen, you are scheduled for a virtual visit with a [facility] provider today. Just as with appointments in the office, your consent must be obtained to participate. Your consent will be active for this visit and any virtual visit you may have with one of our providers in the next 365 days. If you have a MyChart account, a copy of this consent can be sent to you electronically.  As this is a virtual visit, video technology does not allow for your provider to perform a traditional examination. This may limit your provider's ability to fully assess your condition. If your provider identifies any concerns that need to be evaluated in person or the need to arrange testing (such as labs, EKG, etc.), we will make arrangements to do so. Although advances in technology are sophisticated, we cannot ensure that it will always work on either your end or our end. If the connection with a video visit is poor, the visit may have to be switched to a telephone visit. With either a video or telephone visit, we are not always able to ensure that we have a secure connection.  By engaging in this virtual visit, you consent to the provision of healthcare and authorize for your insurance to be billed (if applicable) for the services provided during this visit. Depending on your insurance coverage, you may receive a charge related to this service.  I need to obtain your verbal consent now. Are you willing to proceed with your visit today? Adam Petersen has provided verbal consent on 02/22/2024 for a virtual visit (video or telephone). Adam LELON Servant, NP  Date: 02/22/2024 8:00 PM   Virtual Visit via Video Note   I, Adam Petersen, connected with  Adam Petersen  ([id number], [birth year]) on 02/22/24 at  7:45 PM EDT by a video-enabled telemedicine application and verified that I am speaking with the correct person using two identifiers.  Location: Patient: Virtual Visit Location Patient:  Home Provider: Virtual Visit Location Provider: Home Office   I discussed the limitations of evaluation and management by telemedicine and the availability of in person appointments. The patient expressed understanding and agreed to proceed.    History of Present Illness: Adam Petersen is a 38 y.o. who identifies as a male who was assigned male at birth, and is being seen today for finger laceration.  Mr. Sadowski has a laceration on the left index finger from a cut with a knife 2 days ago.  He would like recommendations today if the area is healing or if it requires any additional treatment.  He has been using alcohol and hydrogen peroxide to clean the area.  He also has a small mole on the back of his left lower leg which is sometimes tender to touch but has not changed significantly in size or texture over the past 10   years Problems:  Patient Active Problem List   Diagnosis Date Noted   Vitamin D  deficiency 12/30/2022   Tobacco dependence 10/09/2022   Seasonal allergic rhinitis 01/24/2022   History of tobacco use 01/23/2022   Chalazion 01/23/2022   Obesity (BMI 30-39.9) 01/23/2022    Allergies: No Known Allergies Medications:  Current Outpatient Medications:    albuterol (VENTOLIN HFA) 108 (90 Base) MCG/ACT inhaler, Inhale 1-2 puffs into the lungs every 4 (four) hours as needed for shortness of breath or wheezing., Disp: , Rfl:    amoxicillin (AMOXIL) 500 MG capsule, Take 500 mg by  mouth 3 (three) times daily., Disp: , Rfl:    azelastine (ASTELIN) 0.1 % nasal spray, Place 2 sprays into both nostrils 2 (two) times daily., Disp: , Rfl:    fluticasone (FLONASE ALLERGY RELIEF) 50 MCG/ACT nasal spray, 1 spray in each nostril, Disp: , Rfl:    guaiFENesin (MUCINEX) 600 MG 12 hr tablet, Take 600 mg by mouth 2 (two) times daily as needed for to loosen phlegm or cough., Disp: , Rfl:    loratadine (CLARITIN) 10 MG tablet, 2 tablets, Disp: , Rfl:    penicillin  v potassium (VEETID) 500 MG  tablet, Take 1 tablet (500 mg total) by mouth 2 (two) times daily., Disp: 20 tablet, Rfl: 0   sodium chloride (OCEAN) 0.65 % SOLN nasal spray, Place 2 sprays into both nostrils every 2 (two) hours while awake., Disp: , Rfl: 0  Observations/Objective: Patient is well-developed, well-nourished in no acute distress.  Resting comfortably at home.  Head is normocephalic, atraumatic.  No labored breathing.  Speech is clear and coherent with logical content.  Patient is alert and oriented at baseline.  Left index finger with small laceration and no surrounding erythema.  Mild swelling with no signs of infection.  Assessment and Plan: 1. Finger pain, left (Primary) Continue using hydrogen peroxide and topical Neosporin to the area  Follow-up with dermatology for mole on lower left leg   Follow Up Instructions: I discussed the assessment and treatment plan with the patient. The patient was provided an opportunity to ask questions and all were answered. The patient agreed with the plan and demonstrated an understanding of the instructions.  A copy of instructions were sent to the patient via MyChart unless otherwise noted below.    The patient was advised to call back or seek an in-person evaluation if the symptoms worsen or if the condition fails to improve as anticipated.    Jhoana Upham W Naydeen Speirs, NP

## 2024-05-04 ENCOUNTER — Telehealth: Payer: Self-pay | Admitting: General Practice

## 2024-05-04 NOTE — Telephone Encounter (Signed)
 RN provided a follow up call with the employee this afternoon, s/p neck and shoulder pain on 05/01/2024.  Mal reports continuing to have some Rt. Shoulder stiffness, no significant pain. Has been rotating cold and heat to the area over the weekend and getting relief.  RN advised to continue current treatment and call the clinic if other needs arise.

## 2024-05-06 ENCOUNTER — Encounter: Payer: Self-pay | Admitting: Registered Nurse

## 2024-05-06 ENCOUNTER — Telehealth: Payer: Self-pay | Admitting: Registered Nurse

## 2024-05-06 DIAGNOSIS — M25512 Pain in left shoulder: Secondary | ICD-10-CM

## 2024-05-06 DIAGNOSIS — M542 Cervicalgia: Secondary | ICD-10-CM

## 2024-05-06 NOTE — Telephone Encounter (Signed)
 Patient reported was lifting box from floor under chair at work 05/01/24 and started to have pain right side of neck  and left shoulder.  Performed stretches/heat/ice/OTC pain medication and pain has resolved and back to normal baseline.  Denied causing him unable to perform work duties.  Denied new or worsening symptoms.  A&Ox3 neck/shoulder/elbows at baseline AROM strength 5/5 bilateral upper extremities.  HR and safety officer notified NP evaluated patient today after reviewing work injury report received from patient supervisor. No further needs identified at this time symptoms have resolved.   Patient instructed to follow up prn if new or worsening symptoms for re-evaluation.  Patient agreed with plan of care and had no further questions at that time.

## 2024-06-02 ENCOUNTER — Ambulatory Visit: Payer: Self-pay | Admitting: General Practice

## 2024-06-02 DIAGNOSIS — Z23 Encounter for immunization: Secondary | ICD-10-CM

## 2024-06-02 NOTE — Progress Notes (Signed)
 Mal presents to the clinic for COVID and flu shots.
